# Patient Record
Sex: Female | Born: 1935
Health system: Southern US, Community
[De-identification: ages and names within clinical notes are randomized; demographics above are authoritative.]

## PROBLEM LIST (undated history)

## (undated) DIAGNOSIS — Z9889 Other specified postprocedural states: Secondary | ICD-10-CM

## (undated) DIAGNOSIS — F32A Depression, unspecified: Secondary | ICD-10-CM

## (undated) DIAGNOSIS — E039 Hypothyroidism, unspecified: Secondary | ICD-10-CM

## (undated) DIAGNOSIS — E7800 Pure hypercholesterolemia, unspecified: Secondary | ICD-10-CM

## (undated) DIAGNOSIS — I10 Essential (primary) hypertension: Secondary | ICD-10-CM

## (undated) DIAGNOSIS — M549 Dorsalgia, unspecified: Secondary | ICD-10-CM

## (undated) DIAGNOSIS — K449 Diaphragmatic hernia without obstruction or gangrene: Secondary | ICD-10-CM

## (undated) DIAGNOSIS — F329 Major depressive disorder, single episode, unspecified: Secondary | ICD-10-CM

## (undated) DIAGNOSIS — M1990 Unspecified osteoarthritis, unspecified site: Secondary | ICD-10-CM

## (undated) DIAGNOSIS — S42302A Unspecified fracture of shaft of humerus, left arm, initial encounter for closed fracture: Secondary | ICD-10-CM

## (undated) DIAGNOSIS — G8929 Other chronic pain: Secondary | ICD-10-CM

## (undated) DIAGNOSIS — R112 Nausea with vomiting, unspecified: Secondary | ICD-10-CM

## (undated) HISTORY — PX: CHOLECYSTECTOMY: SHX55

## (undated) HISTORY — PX: CATARACT EXTRACTION, BILATERAL: SHX1313

## (undated) HISTORY — DX: Dorsalgia, unspecified: M54.9

## (undated) HISTORY — DX: Other chronic pain: G89.29

---

## 1958-03-04 HISTORY — PX: RIGHT OOPHORECTOMY: SHX2359

## 2000-01-22 ENCOUNTER — Encounter: Payer: Self-pay | Admitting: Unknown Physician Specialty

## 2000-01-22 ENCOUNTER — Encounter: Admission: RE | Admit: 2000-01-22 | Discharge: 2000-01-22 | Payer: Self-pay | Admitting: Unknown Physician Specialty

## 2001-09-02 ENCOUNTER — Encounter: Admission: RE | Admit: 2001-09-02 | Discharge: 2001-09-02 | Payer: Self-pay | Admitting: Unknown Physician Specialty

## 2001-09-02 ENCOUNTER — Encounter: Payer: Self-pay | Admitting: Unknown Physician Specialty

## 2001-09-07 ENCOUNTER — Encounter: Payer: Self-pay | Admitting: Family Medicine

## 2001-09-07 ENCOUNTER — Encounter: Admission: RE | Admit: 2001-09-07 | Discharge: 2001-09-07 | Payer: Self-pay | Admitting: Family Medicine

## 2002-09-27 ENCOUNTER — Encounter: Admission: RE | Admit: 2002-09-27 | Discharge: 2002-09-27 | Payer: Self-pay | Admitting: Unknown Physician Specialty

## 2002-09-27 ENCOUNTER — Encounter: Payer: Self-pay | Admitting: Unknown Physician Specialty

## 2003-02-28 ENCOUNTER — Encounter: Admission: RE | Admit: 2003-02-28 | Discharge: 2003-02-28 | Payer: Self-pay | Admitting: Orthopedic Surgery

## 2003-03-18 ENCOUNTER — Encounter: Admission: RE | Admit: 2003-03-18 | Discharge: 2003-03-18 | Payer: Self-pay | Admitting: Orthopedic Surgery

## 2003-04-12 ENCOUNTER — Encounter: Admission: RE | Admit: 2003-04-12 | Discharge: 2003-04-12 | Payer: Self-pay | Admitting: Orthopedic Surgery

## 2003-07-26 ENCOUNTER — Ambulatory Visit (HOSPITAL_COMMUNITY): Admission: RE | Admit: 2003-07-26 | Discharge: 2003-07-26 | Payer: Self-pay | Admitting: Ophthalmology

## 2003-12-26 ENCOUNTER — Ambulatory Visit (HOSPITAL_COMMUNITY): Admission: RE | Admit: 2003-12-26 | Discharge: 2003-12-26 | Payer: Self-pay | Admitting: Unknown Physician Specialty

## 2004-04-09 ENCOUNTER — Ambulatory Visit: Payer: Self-pay | Admitting: Orthopedic Surgery

## 2004-05-11 ENCOUNTER — Encounter: Admission: RE | Admit: 2004-05-11 | Discharge: 2004-05-11 | Payer: Self-pay | Admitting: Orthopedic Surgery

## 2004-05-24 ENCOUNTER — Encounter: Admission: RE | Admit: 2004-05-24 | Discharge: 2004-05-24 | Payer: Self-pay | Admitting: Orthopedic Surgery

## 2004-06-08 ENCOUNTER — Encounter: Admission: RE | Admit: 2004-06-08 | Discharge: 2004-06-08 | Payer: Self-pay | Admitting: Orthopedic Surgery

## 2004-06-27 ENCOUNTER — Ambulatory Visit: Payer: Self-pay | Admitting: Orthopedic Surgery

## 2004-10-18 ENCOUNTER — Ambulatory Visit: Payer: Self-pay | Admitting: Orthopedic Surgery

## 2004-10-22 ENCOUNTER — Ambulatory Visit (HOSPITAL_COMMUNITY): Admission: RE | Admit: 2004-10-22 | Discharge: 2004-10-22 | Payer: Self-pay | Admitting: Internal Medicine

## 2005-01-14 ENCOUNTER — Ambulatory Visit (HOSPITAL_COMMUNITY): Admission: RE | Admit: 2005-01-14 | Discharge: 2005-01-14 | Payer: Self-pay | Admitting: Family Medicine

## 2005-01-17 ENCOUNTER — Ambulatory Visit (HOSPITAL_COMMUNITY): Admission: RE | Admit: 2005-01-17 | Discharge: 2005-01-17 | Payer: Self-pay | Admitting: Family Medicine

## 2005-03-11 ENCOUNTER — Encounter: Payer: Self-pay | Admitting: Internal Medicine

## 2005-03-11 ENCOUNTER — Ambulatory Visit (HOSPITAL_COMMUNITY): Admission: RE | Admit: 2005-03-11 | Discharge: 2005-03-11 | Payer: Self-pay | Admitting: Internal Medicine

## 2005-03-11 ENCOUNTER — Ambulatory Visit: Payer: Self-pay | Admitting: Internal Medicine

## 2005-10-24 ENCOUNTER — Ambulatory Visit (HOSPITAL_COMMUNITY): Admission: RE | Admit: 2005-10-24 | Discharge: 2005-10-24 | Payer: Self-pay | Admitting: Internal Medicine

## 2005-11-20 ENCOUNTER — Ambulatory Visit: Payer: Self-pay | Admitting: Orthopedic Surgery

## 2005-11-25 ENCOUNTER — Ambulatory Visit (HOSPITAL_COMMUNITY): Admission: RE | Admit: 2005-11-25 | Discharge: 2005-11-25 | Payer: Self-pay | Admitting: Orthopedic Surgery

## 2005-11-26 ENCOUNTER — Ambulatory Visit (HOSPITAL_COMMUNITY): Admission: RE | Admit: 2005-11-26 | Discharge: 2005-11-26 | Payer: Self-pay | Admitting: Orthopedic Surgery

## 2005-12-02 ENCOUNTER — Ambulatory Visit: Payer: Self-pay | Admitting: Orthopedic Surgery

## 2005-12-05 ENCOUNTER — Encounter (HOSPITAL_COMMUNITY): Admission: RE | Admit: 2005-12-05 | Discharge: 2006-01-04 | Payer: Self-pay | Admitting: Orthopedic Surgery

## 2005-12-26 ENCOUNTER — Ambulatory Visit: Payer: Self-pay | Admitting: Orthopedic Surgery

## 2005-12-30 ENCOUNTER — Ambulatory Visit: Payer: Self-pay | Admitting: Orthopedic Surgery

## 2006-01-20 ENCOUNTER — Ambulatory Visit: Payer: Self-pay | Admitting: Orthopedic Surgery

## 2006-03-03 ENCOUNTER — Ambulatory Visit (HOSPITAL_COMMUNITY): Admission: RE | Admit: 2006-03-03 | Discharge: 2006-03-03 | Payer: Self-pay | Admitting: Family Medicine

## 2006-04-29 ENCOUNTER — Ambulatory Visit (HOSPITAL_COMMUNITY): Admission: RE | Admit: 2006-04-29 | Discharge: 2006-04-29 | Payer: Self-pay | Admitting: Family Medicine

## 2006-05-15 ENCOUNTER — Ambulatory Visit (HOSPITAL_COMMUNITY): Admission: RE | Admit: 2006-05-15 | Discharge: 2006-05-15 | Payer: Self-pay | Admitting: Family Medicine

## 2006-05-19 ENCOUNTER — Ambulatory Visit (HOSPITAL_COMMUNITY): Admission: RE | Admit: 2006-05-19 | Discharge: 2006-05-19 | Payer: Self-pay | Admitting: Unknown Physician Specialty

## 2006-06-03 ENCOUNTER — Ambulatory Visit: Payer: Self-pay | Admitting: Orthopedic Surgery

## 2006-07-16 ENCOUNTER — Ambulatory Visit: Payer: Self-pay | Admitting: Orthopedic Surgery

## 2006-10-23 ENCOUNTER — Ambulatory Visit: Payer: Self-pay | Admitting: Orthopedic Surgery

## 2006-11-24 ENCOUNTER — Ambulatory Visit: Payer: Self-pay | Admitting: Orthopedic Surgery

## 2006-12-25 ENCOUNTER — Telehealth: Payer: Self-pay | Admitting: Orthopedic Surgery

## 2007-01-21 ENCOUNTER — Encounter: Payer: Self-pay | Admitting: Orthopedic Surgery

## 2007-05-18 ENCOUNTER — Ambulatory Visit: Payer: Self-pay | Admitting: Orthopedic Surgery

## 2007-05-18 DIAGNOSIS — M7660 Achilles tendinitis, unspecified leg: Secondary | ICD-10-CM

## 2007-06-03 ENCOUNTER — Ambulatory Visit: Payer: Self-pay | Admitting: Orthopedic Surgery

## 2007-06-03 DIAGNOSIS — M545 Low back pain: Secondary | ICD-10-CM | POA: Insufficient documentation

## 2007-06-03 DIAGNOSIS — M5380 Other specified dorsopathies, site unspecified: Secondary | ICD-10-CM | POA: Insufficient documentation

## 2007-07-22 ENCOUNTER — Telehealth: Payer: Self-pay | Admitting: Orthopedic Surgery

## 2007-08-06 ENCOUNTER — Ambulatory Visit (HOSPITAL_COMMUNITY): Admission: RE | Admit: 2007-08-06 | Discharge: 2007-08-06 | Payer: Self-pay | Admitting: Unknown Physician Specialty

## 2007-11-12 ENCOUNTER — Telehealth: Payer: Self-pay | Admitting: Orthopedic Surgery

## 2007-12-11 ENCOUNTER — Telehealth: Payer: Self-pay | Admitting: Orthopedic Surgery

## 2007-12-31 ENCOUNTER — Ambulatory Visit (HOSPITAL_COMMUNITY): Admission: RE | Admit: 2007-12-31 | Discharge: 2007-12-31 | Payer: Self-pay | Admitting: Internal Medicine

## 2008-04-20 ENCOUNTER — Ambulatory Visit: Payer: Self-pay | Admitting: Orthopedic Surgery

## 2008-04-20 DIAGNOSIS — M542 Cervicalgia: Secondary | ICD-10-CM

## 2008-09-07 ENCOUNTER — Ambulatory Visit (HOSPITAL_COMMUNITY): Admission: RE | Admit: 2008-09-07 | Discharge: 2008-09-07 | Payer: Self-pay | Admitting: Unknown Physician Specialty

## 2009-04-03 ENCOUNTER — Ambulatory Visit: Payer: Self-pay | Admitting: Orthopedic Surgery

## 2009-04-12 ENCOUNTER — Ambulatory Visit (HOSPITAL_COMMUNITY): Admission: RE | Admit: 2009-04-12 | Discharge: 2009-04-12 | Payer: Self-pay | Admitting: Family Medicine

## 2009-05-03 ENCOUNTER — Encounter: Payer: Self-pay | Admitting: Orthopedic Surgery

## 2009-09-26 ENCOUNTER — Telehealth: Payer: Self-pay | Admitting: Orthopedic Surgery

## 2009-10-30 ENCOUNTER — Ambulatory Visit (HOSPITAL_COMMUNITY): Admission: RE | Admit: 2009-10-30 | Discharge: 2009-10-30 | Payer: Self-pay | Admitting: Unknown Physician Specialty

## 2009-12-17 ENCOUNTER — Encounter: Payer: Self-pay | Admitting: Orthopedic Surgery

## 2009-12-26 ENCOUNTER — Encounter: Payer: Self-pay | Admitting: Orthopedic Surgery

## 2009-12-26 ENCOUNTER — Ambulatory Visit (HOSPITAL_COMMUNITY): Admission: RE | Admit: 2009-12-26 | Discharge: 2009-12-26 | Payer: Self-pay | Admitting: Internal Medicine

## 2009-12-27 ENCOUNTER — Ambulatory Visit: Payer: Self-pay | Admitting: Orthopedic Surgery

## 2009-12-27 DIAGNOSIS — M659 Synovitis and tenosynovitis, unspecified: Secondary | ICD-10-CM

## 2010-01-09 ENCOUNTER — Encounter: Payer: Self-pay | Admitting: Orthopedic Surgery

## 2010-01-24 ENCOUNTER — Encounter: Admission: RE | Admit: 2010-01-24 | Discharge: 2010-01-24 | Payer: Self-pay | Admitting: Neurosurgery

## 2010-02-01 ENCOUNTER — Encounter: Admission: RE | Admit: 2010-02-01 | Discharge: 2010-02-01 | Payer: Self-pay | Admitting: Neurosurgery

## 2010-02-07 ENCOUNTER — Ambulatory Visit: Payer: Self-pay | Admitting: Orthopedic Surgery

## 2010-03-21 ENCOUNTER — Encounter
Admission: RE | Admit: 2010-03-21 | Discharge: 2010-03-21 | Payer: Self-pay | Source: Home / Self Care | Attending: Neurosurgery | Admitting: Neurosurgery

## 2010-04-03 NOTE — Medication Information (Signed)
Summary: Tax adviser   Imported By: Cammie Sickle 02/07/2010 10:42:43  _____________________________________________________________________  External Attachment:    Type:   Image     Comment:   External Document

## 2010-04-03 NOTE — Assessment & Plan Note (Signed)
Summary: 6 WK RE-CK LT FOOT/?XRAY/MEDICARE,AARP/CAF   Visit Type:  Follow-up Referring Provider:  Dr. Ouida Sills Primary Provider:  Dr. Ouida Sills  CC:  RECHECK LEFT FOOT.  History of Present Illness: I saw Kelli Gregory in the office today for a visit.  She is a 75 years old woman with the complaint of:  left ankle pain and swelling   This is a 75 yo female with severe pain and swelling over the medial side of her left ankle which came on suddenly overnight without trauma.  Dr Ouida Sills treated her for gout without relief: was put on Prednisone taper, Naproxen two times a day for 3 days, Colchicine for 10 days for gout.  Xrays left ankle and foot [facility] 12/26/09: no acute disease ? medial malleolar bone frag without known cause.   Meds: Lisinopril, Synthroid, Ibuprofen 800mg .  Today is recheck after Cam walker and Epson salt for Posterior tendon tear grade 3.  Feels better with boot on.     Allergies: No Known Drug Allergies  Physical Exam  Additional Exam:  GENERAL: Appearance is normal   CDV: normal pulse and temperature     Foot looks much better. No tenderness. No swelling. Still has flatfoot deformity.  Also has an ingrown RIGHT toenail on the LEFT with redness and tenderness. Advised see podiatrist for that   Impression & Recommendations:  Problem # 1:  TENOSYNOVITIS OF FOOT AND ANKLE (ICD-727.06) Assessment Improved  Convert to [facility] orthotics follow up as needed  Orders: Est. Patient Level II ([address]109)  Patient Instructions: 1)  [facility]  2)  REMOVE [facility]THOT[facility]    Orders Added: 1)  Est. Patient Level II [60454]

## 2010-04-03 NOTE — Letter (Signed)
Summary: Applic for handicapped placard  Applic for handicapped placard   Imported By: Cammie Sickle 06/30/2009 12:27:30  _____________________________________________________________________  External Attachment:    Type:   Image     Comment:   External Document

## 2010-04-03 NOTE — Progress Notes (Signed)
Summary: MRI appointment.  Phone Note Outgoing Call   Call placed by: Waldon Reining,  September 26, 2009 12:24 PM Call placed to: Patient Action Taken: Appt scheduled Summary of Call: I called to give patient her MRI appointment at [facility] on 09-29-09 at 8:15 am. Patient has [facility], authorization #[id number] and it expires on 11-10-09. Patient will follow up here for her results. Patient is aware to bring a copy of her films.

## 2010-04-03 NOTE — Medication Information (Signed)
Summary: Tax adviser   Imported By: Cammie Sickle 12/27/2009 16:52:59  _____________________________________________________________________  External Attachment:    Type:   Image     Comment:   External Document

## 2010-04-03 NOTE — Progress Notes (Signed)
Summary: Referral to Dr. Gerilyn Pilgrim for NCS.  Phone Note Outgoing Call   Call placed by: Waldon Reining,  September 26, 2009 12:27 PM Call placed to: Specialist Action Taken: Information Sent Summary of Call: I faxed a referral for this patient to Dr. Gerilyn Pilgrim for NCS of upper extremity for radiculopathy vs. carpal tunnel.

## 2010-04-03 NOTE — Assessment & Plan Note (Signed)
Summary: BI HEEL PAIN/MEDICARE/AARP/BSF   Visit Type:  Follow-up  CC:  bilateral heel pain.Marland Kitchen  History of Present Illness: I saw Kelli Gregory in the office today for a followup visit.  She is a 75 years old woman with the complaint of:  BILATERAL HEEL PAIN, ACHILLES TENDINITIS.  She's had treatment for bilateral Achilles tendinopathy with MRI documented disease, no tear.  Pain continues in the posterior aspect of the heel which is aching moderately severe has become constant and present for over 2 years, last injection done then.  She is currently doing exercises at home.  She does respond well to steroid Dosepak plus or minus injection   review of systems history of back pain which has become worse as well.  Does not seem to have radiculopathy.  Also complains of groin cramping and foot cramping intermittent, electrolytes normal reported to her by Dr. Ouida Sills.  Exam well-developed well-nourished well-groomed  Oriented x3  Mood affect normal  Gait and station she is limping  Mild varicose veins normal temperature both feet good pulses in both feet  Sensation normal both feet  No pathologic reflexes toes downgoing  Coordination and balance normal  Lower extremities lymph nodes normal  Both feet skin normal, Achilles tendon strength normal, ankle stability normal, range of motion ankle normal, tenderness in the Achilles tendon at the insertion.     Allergies: No Known Drug Allergies  Past History:  Past Medical History: Last updated: 04/20/2008 Cyst on liver anxiety htn  Past Surgical History: Last updated: 12/25/2006 Ovarian cyst Cholecystectomy  Review of Systems      See HPI   Impression & Recommendations:  Problem # 1:  ACHILLES TENDINITIS (ICD-726.71) Assessment Deteriorated  bilateral disease  Bilateral ankle injections no complications  //verbal consent was given  The retrocalcaneal bursa were injected in both ankles no complications  Medication  40 mg of Depo-Medrol 1 cc and 3 cc of 1% lidocaine  Orders: Est. Patient Level IV (45409) Joint Aspirate / Injection, Large (20610) Depo- Medrol 40mg  (J1030)  Patient Instructions: 1)  You have received an injection of cortisone today. You may experience increased pain at the injection site. Apply ice pack to the area for 20 minutes every 2 hours and take 2 xtra strength tylenol every 8 hours. This increased pain will usually resolve in 24 hours. The injection will take effect in 3-10 days.   2)  Do your exercises  3)  Ice daily  Prescriptions: STERAPRED DS 12 DAY 10 MG TABS (PREDNISONE)   #1 x 1   Entered and Authorized by:   Fuller Canada MD   Signed by:   Fuller Canada MD on 04/03/2009   Method used:   Faxed to ...       [facility]  [address] [phone]* (retail)       [address]       [place], [place]  [address]       Ph: [phone] or [phone]       Fax: [phone]   RxID:   [id number]

## 2010-04-03 NOTE — Letter (Signed)
Summary: note for referral  note for referral   Imported By: Jacklynn Ganong 01/10/2010 16:50:03  _____________________________________________________________________  External Attachment:    Type:   Image     Comment:   External Document

## 2010-04-03 NOTE — Letter (Signed)
Summary: History form  History form   Imported By: Jacklynn Ganong 01/11/2010 08:27:24  _____________________________________________________________________  External Attachment:    Type:   Image     Comment:   External Document

## 2010-04-03 NOTE — Letter (Signed)
Summary: Physician's order for cane  Physician's order for cane   Imported By: Jacklynn Ganong 01/09/2010 13:33:43  _____________________________________________________________________  External Attachment:    Type:   Image     Comment:   External Document

## 2010-04-03 NOTE — Assessment & Plan Note (Signed)
Summary: FX LEFT FOOT/MCR/FAGAN/BSF   Visit Type:  new problem Referring Provider:  Dr. Ouida Sills Primary Provider:  Dr. Ouida Sills  CC:  left ankle pain.  History of Present Illness: I saw Kelli Gregory in the office today for a new problem visit.  She is a 75 years old woman with the complaint of:  left ankle pain and swelling for 11 days.  This is a 75 yo female with severe pain and swelling over the medial side of her left ankle which came on suddenly overnight without trauma.  Dr Ouida Sills treated her for gout without relief: was put on Prednisone taper, Naproxen two times a day for 3 days, Colchicine for 10 days for gout.  Xrays left ankle and foot [facility] 12/26/09: no acute disease ? medial malleolar bone frag without known cause.   Meds: Lisinopril, Synthroid.      Current Medications (verified): 1)  Synthroid 2)  Lisinopril 10 Mg Tabs (Lisinopril)  Allergies (verified): No Known Drug Allergies  Past History:  Past Surgical History: Last updated: 12/25/2006 Ovarian cyst Cholecystectomy  Past Medical History: Cyst on liver anxiety htn thyroid back pain  Family History: Family History Coronary Heart Disease female < 43 Family History of Arthritis  Social History: Patient is divorced.  retired no smoking no alcohol 1 cup of coffee per day 12th grade ed  Review of Systems Constitutional:  Complains of fatigue; denies weight loss, weight gain, fever, and chills. Gastrointestinal:  Complains of heartburn; denies vomiting, diarrhea, constipation, and blood in your stools. Musculoskeletal:  Complains of joint pain; denies swelling, instability, stiffness, redness, heat, and muscle pain. HEENT:  Complains of watering; denies blurred or double vision, eye pain, and redness. Immunology:  Complains of seasonal allergies; denies sinus problems and allergic to bee stings.  The review of systems is negative for Cardiovascular, Respiratory, Genitourinary, Neurologic, Endocrine,  Psychiatric, Skin, and Hemoatologic.  Physical Exam  Skin:  intact without lesions or rashes Psych:  alert and cooperative; normal mood and affect; normal attention span and concentration   Foot/Ankle Exam  General:    Well-developed, well-nourished ,normal body habitus; no deformities, normal grooming.    Gait:    antalgic.    Skin:    Intact with no scars, lesions, rashes, cafe-au-lait spots or bruising.    Inspection:    swelling: MEDIAL ANKLE OVER THE POST TIB TENDON   TOO MANY TOES SIGN BILATERAL LEFT > RIGHT   FALLEN MEDIAL ARCH L > R   Palpation:    PTTtenderness L-medial malleoulus:   Vascular:    dorsalis pedis and posterior tibial pulses 2+ and symmetric, capillary refill < 2 seconds, normal hair pattern, no evidence of ischemia.   Sensory:    gross sensation intact bilaterally in lower extremities.    Motor:    SINGLE LEG HEEL RAISE PWEAKNESS; COULD NOT PERFORM 1 !  Reflexes:    Normal and symmetric Achilles reflexes bilaterally.    Ankle Exam:    Right:    Inspection:  Abnormal    Palpation:  Normal    Stability:  stable    NORMAL ROM     Left:    Inspection:  Abnormal    Palpation:  Abnormal       Location:  MIDFOOT    Stability:  stable    NORMAL ROM    Impression & Recommendations:  Problem # 1:  TENOSYNOVITIS OF FOOT AND ANKLE (ICD-727.06) Assessment New  PTTD LEFT ANKLE GRADE 2/3   CAM WALKER 6  WEEKS   Orders: Est. Patient Level IV (16109)  Patient Instructions: 1)  Grade 3 Posterior tendon tear 2)  go in boot for 6 weeks, wear while walking. 3)  Do Epson salt soaks for 20 minutes two times a day. 4)  come back in 6 weeks. 5)  If not better will need MRI.   Orders Added: 1)  Est. Patient Level IV [60454]

## 2010-04-03 NOTE — Consult Note (Signed)
Summary: Consult note from Dr. Channing Mutters  Consult note from Dr. Channing Mutters   Imported By: Jacklynn Ganong 05/11/2009 16:08:24  _____________________________________________________________________  External Attachment:    Type:   Image     Comment:   External Document

## 2010-04-17 ENCOUNTER — Other Ambulatory Visit: Payer: Self-pay | Admitting: Neurosurgery

## 2010-04-17 DIAGNOSIS — M47816 Spondylosis without myelopathy or radiculopathy, lumbar region: Secondary | ICD-10-CM

## 2010-04-18 ENCOUNTER — Ambulatory Visit (INDEPENDENT_AMBULATORY_CARE_PROVIDER_SITE_OTHER): Payer: Medicare Other | Admitting: Orthopedic Surgery

## 2010-04-18 ENCOUNTER — Encounter: Payer: Self-pay | Admitting: Orthopedic Surgery

## 2010-04-18 DIAGNOSIS — M659 Synovitis and tenosynovitis, unspecified: Secondary | ICD-10-CM

## 2010-04-19 ENCOUNTER — Ambulatory Visit
Admission: RE | Admit: 2010-04-19 | Discharge: 2010-04-19 | Disposition: A | Discharge status: Home / Self Care | Payer: Medicare Other | Source: Ambulatory Visit | Attending: Neurosurgery | Admitting: Neurosurgery

## 2010-04-19 DIAGNOSIS — M47816 Spondylosis without myelopathy or radiculopathy, lumbar region: Secondary | ICD-10-CM

## 2010-04-25 NOTE — Miscellaneous (Signed)
Summary: physical therapy order  physical therapy order   Imported By: Cammie Sickle 04/18/2010 20:24:14  _____________________________________________________________________  External Attachment:    Type:   Image     Comment:   External Document

## 2010-04-25 NOTE — Assessment & Plan Note (Signed)
Summary: reck foot,still hurts/mcr   Referring Provider:  Dr. Ouida Sills Primary Provider:  Dr. Ouida Sills   History of Present Illness: posterior tibial tendon dysfunction, type II.  Recurrent LEFT foot pain, medial ankle. Patient has already started wearing a cam walker for approximately one week with improvement in her symptoms. She is also taking ibuprofen as needed.  Pain level III/IV.  Review of systems having trouble with her lumbar spine schedule for epidural injection.  No new injuries to the LEFT ankle.  Examination shows swelling and tenderness over the medial malleolar region. She has decreased pushoff noted with a single-leg heel raise.  Impression posterior tibial tendon dysfunction, type II. Plan physical therapy for 6 weeks  Allergies: No Known Drug Allergies   Impression & Recommendations:  Problem # 1:  TENOSYNOVITIS OF FOOT AND ANKLE (ICD-727.06) Assessment Deteriorated  Orders: Est. Patient Level II (16109)  Patient Instructions: 1)  PHYSICAL THERAPY ON LEFT FOOT  2)  FOR POSTERIOR TIBIAL TENDON DISEASE  3)  Please schedule a follow-up appointment as needed.   Orders Added: 1)  Est. Patient Level II [60454]

## 2010-07-20 NOTE — Op Note (Signed)
NAME:  Kelli Gregory, Kelli Gregory                 ACCOUNT NO.:  [id number]   MEDICAL RECORD NO.:  [id number]          PATIENT TYPE:  AMB   LOCATION:  DAY                           FACILITY:  [facility]   PHYSICIAN:  R. Roetta Sessions, M.D. DATE OF BIRTH:  1935-08-23   DATE OF PROCEDURE:  03/11/2005  DATE OF DISCHARGE:                                 OPERATIVE REPORT   PROCEDURE:  Colonoscopy with biopsy.   INDICATIONS FOR PROCEDURE:  The patient is a 75 year old lady with no GI  symptoms sent over at courtesy of Dr. Ouida Sills for colorectal cancer screening.  She has never had her lower GI imaged. She has no family history of  colorectal neoplasia. Colonoscopy is now being done. This approach has been  discussed with the patient at length. Potential risks, benefits, and  alternatives have been reviewed and questions answered. She is agreeable.  Please see documentation in the medical record.   PROCEDURE NOTE:  O2 saturation, blood pressure, pulse, and respirations were  monitored throughout the entire procedure. Conscious sedation with Versed 5  mg IV and Demerol 125 mg IV in divided doses.   INSTRUMENT:  Olympus video chip system.   FINDINGS:  Digital rectal exam revealed no abnormalities.   ENDOSCOPIC FINDINGS:  Prep was good.   Rectum:  Examination of the rectal mucosa including retroflexed view of the  anal verge revealed no abnormalities.   Colon:  Colonic mucosa was surveyed from the rectosigmoid junction through  the left, transverse, and right colon to the area of the appendiceal  orifice, ileocecal valve, and cecum. These structures were well seen and  photographed for the record. From this level, the scope was slowly  withdrawn, and all previously mentioned mucosal surfaces were again seen.  The patient had a very long tortuous colon. It was redundant. The colonic  mucosa, however, was well seen and appeared normal aside from a 3-mm polyp  at 20 cm (rectosigmoid). It was cold  biopsied/removed. The patient tolerated  the procedure well and was reactive to endoscopy.   IMPRESSION:  Normal rectum. Diminutive polyp at 20 cm, cold  biopsied/removed. Long redundant but otherwise normal appearing colon.   RECOMMENDATIONS:  1.  Follow up on pathology.  2.  Further recommendations to follow.      Jonathon Bellows, M.D.  Electronically Signed     RMR/MEDQ  D:  03/11/2005  T:  03/11/2005  Job:  [id number]   cc:   Kingsley Callander. Ouida Sills, MD  Fax: [phone]

## 2010-07-23 ENCOUNTER — Other Ambulatory Visit: Payer: Self-pay | Admitting: *Deleted

## 2010-07-23 DIAGNOSIS — R52 Pain, unspecified: Secondary | ICD-10-CM

## 2010-07-23 MED ORDER — IBUPROFEN 800 MG PO TABS: 800.0000 mg | ORAL_TABLET | Freq: Three times a day (TID) | ORAL | Status: DC | PRN

## 2010-10-19 ENCOUNTER — Emergency Department (HOSPITAL_COMMUNITY)
Admission: EM | Admit: 2010-10-19 | Discharge: 2010-10-19 | Disposition: A | Discharge status: Home / Self Care | Payer: Medicare Other | Attending: Emergency Medicine | Admitting: Emergency Medicine

## 2010-10-19 ENCOUNTER — Emergency Department (HOSPITAL_COMMUNITY): Payer: Medicare Other

## 2010-10-19 ENCOUNTER — Encounter: Payer: Self-pay | Admitting: *Deleted

## 2010-10-19 DIAGNOSIS — W010XXA Fall on same level from slipping, tripping and stumbling without subsequent striking against object, initial encounter: Secondary | ICD-10-CM | POA: Insufficient documentation

## 2010-10-19 DIAGNOSIS — S52609A Unspecified fracture of lower end of unspecified ulna, initial encounter for closed fracture: Secondary | ICD-10-CM | POA: Insufficient documentation

## 2010-10-19 DIAGNOSIS — S52502A Unspecified fracture of the lower end of left radius, initial encounter for closed fracture: Secondary | ICD-10-CM

## 2010-10-19 DIAGNOSIS — S52509A Unspecified fracture of the lower end of unspecified radius, initial encounter for closed fracture: Secondary | ICD-10-CM | POA: Insufficient documentation

## 2010-10-19 DIAGNOSIS — Y92009 Unspecified place in unspecified non-institutional (private) residence as the place of occurrence of the external cause: Secondary | ICD-10-CM | POA: Insufficient documentation

## 2010-10-19 DIAGNOSIS — I10 Essential (primary) hypertension: Secondary | ICD-10-CM | POA: Insufficient documentation

## 2010-10-19 DIAGNOSIS — E7800 Pure hypercholesterolemia, unspecified: Secondary | ICD-10-CM | POA: Insufficient documentation

## 2010-10-19 HISTORY — DX: Pure hypercholesterolemia, unspecified: E78.00

## 2010-10-19 HISTORY — DX: Essential (primary) hypertension: I10

## 2010-10-19 MED ORDER — ONDANSETRON 8 MG PO TBDP
8.0000 mg | ORAL_TABLET | Freq: Once | ORAL | Status: AC
  Administered 2010-10-19: 8 mg via ORAL
  Filled 2010-10-19: qty 1

## 2010-10-19 MED ORDER — HYDROCODONE-ACETAMINOPHEN 5-325 MG PO TABS: 1.0000 | ORAL_TABLET | ORAL | Status: AC | PRN

## 2010-10-19 MED ORDER — HYDROMORPHONE HCL 1 MG/ML IJ SOLN
1.0000 mg | Freq: Once | INTRAMUSCULAR | Status: AC
  Administered 2010-10-19: 1 mg via INTRAMUSCULAR
  Filled 2010-10-19: qty 1

## 2010-10-19 MED ORDER — METOCLOPRAMIDE HCL 5 MG/ML IJ SOLN
10.0000 mg | Freq: Once | INTRAMUSCULAR | Status: AC
  Administered 2010-10-19: 10 mg via INTRAMUSCULAR
  Filled 2010-10-19: qty 2

## 2010-10-19 NOTE — ED Notes (Signed)
Pt c/o pain to left wrist; +deformity noted; pt states she tripped over a hose and fell on left wrist

## 2010-10-19 NOTE — ED Notes (Signed)
Pt fell outside around 1600 today. States she "tripped over a water hose when taking the dogs out." Denies hitting her head. No shoulder or elbow pain, no back, neck hip or leg pain.

## 2010-10-19 NOTE — ED Notes (Signed)
Patient in room vomiting, Dr. Fonnie Jarvis in room, patient had been given zofran ODT

## 2010-10-19 NOTE — ED Provider Notes (Signed)
Scribed for Donnetta Hutching, MD, the patient was seen in room 5. This chart was scribed by Jannette Fogo. This patient's care was started at 17:55.   CSN: [id number] Arrival date & time: 10/19/2010  5:12 PM  Chief Complaint  Patient presents with  . Wrist Pain    left   HPI Kelli Gregory is a 75 y.o. female who presents to the Emergency Department complaining of left wrist pain status post fall occuring prior to arrival. Patient states she tripped over a water hose, and fell landing onto the left wrist. She reports pain exacerbation with movement. Patient denies any head injury, neck pain, or other injuries. There are no other associated symptoms and no other alleviating or aggravating factors.    Past Medical History  Diagnosis Date  . Hypertension   . Hypercholesteremia     Past Surgical History  Procedure Date  . Cholecystectomy   . Right oophorectomy    MEDICATIONS:  Previous Medications   CITALOPRAM (CELEXA) 20 MG TABLET    Take 20 mg by mouth every morning.     GABAPENTIN (NEURONTIN) 600 MG TABLET    Take 600 mg by mouth at bedtime.     IBUPROFEN (ADVIL,MOTRIN) 800 MG TABLET    Take 1 tablet (800 mg total) by mouth every 8 (eight) hours as needed for pain.   LEVOTHYROXINE (SYNTHROID, LEVOTHROID) 112 MCG TABLET    Take 112 mcg by mouth daily.     LOSARTAN (COZAAR) 50 MG TABLET    Take 50 mg by mouth every morning.     SODIUM CHLORIDE (OCEAN) 0.65 % NASAL SPRAY    Place 2 sprays into the nose at bedtime as needed.       ALLERGIES:  Allergies as of 10/19/2010 - Review Complete 10/19/2010  Allergen Reaction Noted  . Poison sumac extract Itching, Swelling, and Rash 10/19/2010    FAMILY HISTORY:  No Pertinent Family History  SOCIAL HISTORY: Accompanied to the ED by spouse and sister-in-law.  History  Substance Use Topics  . Smoking status: Never Smoker   . Smokeless tobacco: Not on file  . Alcohol Use: No   Review of Systems  HENT: Negative for neck pain.     Musculoskeletal: Positive for joint swelling (left wrist ). Negative for back pain.       Left wrist pain   Neurological: Negative for headaches.  All other systems reviewed and are negative.   Physical Exam  BP 140/58  Pulse 82  Temp(Src) 98.2 F (36.8 C) (Oral)  Resp 18  Ht 5\' 7"  (1.702 m)  Wt 200 lb (90.719 kg)  BMI 31.32 kg/m2  SpO2 98%  Physical Exam  Constitutional: She is oriented to person, place, and time. She appears well-developed and well-nourished. No distress.  HENT:  Head: Normocephalic and atraumatic.  Mouth/Throat: Oropharynx is clear and moist.  Eyes: Pupils are equal, round, and reactive to light.  Neck: Neck supple.       Non-tender   Cardiovascular: Intact distal pulses.   Pulmonary/Chest: Effort normal.  Musculoskeletal: She exhibits tenderness (over the left distal radius ).  Neurological: She is alert and oriented to person, place, and time. No cranial nerve deficit.  Skin: Skin is warm and dry.  Psychiatric: She has a normal mood and affect. Her behavior is normal.    OTHER DATA REVIEWED: Nursing notes, vital signs, and past medical records reviewed.   DIAGNOSTIC STUDIES: Oxygen Saturation is 98% on room air, normal by my interpretation.  RADIOLOGY:  XRAY LEFT WRIST: 3+ View; Interpreted by Radiologist Dr. Lollie Marrow and reviewed by me: Comminuted displaced and angulated distal left radial metaphyseal fracture with questionable extension into the distal radioulnar and radiocarpal joints. Suspect tiny fracture at tip of ulnar styloid process. Per CMS PQRS reporting requirements (PQRS Measure 24): Given the patient's age of greater than 50 and the fracture site (hip, distal radius, or spine), the patient should be tested for osteoporosis using DXA, and the appropriate treatment considered based on the DXA results.  PROCEDURES: The patient was placed in a short arm sugar-tong splint (from forearm incorporating the hand and thumb). Following  splint application I rechecked the position and circulation and neuro function. The splint was in good position with good neurovascular function. The left wrist was well immobilized.   ED COURSE / COORDINATION OF CARE: 17:56 - ED physician discussed XRAY results with the patient and family. Recommended splint placement and advised patient to follow up with orthopedist. Patient requesting pain medications but refuses to take Oxycontin.  20:08 - Splint applied, patient stable for discharge    MDM: splint, referral to ortho  IMPRESSION: Diagnoses that have been ruled out:  Diagnoses that are still under consideration:  Final diagnoses:  Closed fracture of left distal radius    PLAN:  Home Referral Orthopedics The patient is to return the emergency department if there is any worsening of symptoms. I have reviewed the discharge instructions with the patient and family.   CONDITION ON DISCHARGE: Stable  MEDICATIONS GIVEN IN THE E.D.  Medications  levothyroxine (SYNTHROID, LEVOTHROID) 112 MCG tablet (not administered)  losartan (COZAAR) 50 MG tablet (not administered)  gabapentin (NEURONTIN) 600 MG tablet (not administered)  citalopram (CELEXA) 20 MG tablet (not administered)  sodium chloride (OCEAN) 0.65 % nasal spray (not administered)  HYDROcodone-acetaminophen (NORCO) 5-325 MG per tablet (not administered)  HYDROmorphone (DILAUDID) injection 1 mg (1 mg Intramuscular Given 10/19/10 1823)  ondansetron (ZOFRAN-ODT) disintegrating tablet 8 mg (8 mg Oral Given 10/19/10 1848)  metoCLOPramide (REGLAN) injection 10 mg (10 mg Intramuscular Given 10/19/10 1919)    DISCHARGE MEDICATIONS: New Prescriptions   HYDROCODONE-ACETAMINOPHEN (NORCO) 5-325 MG PER TABLET    Take 1-2 tablets by mouth every 4 (four) hours as needed for pain.    Procedures     I personally performed the services described in this documentation, which was scribed in my presence. The recorded information has been  reviewed and considered. No att. providers found   Donnetta Hutching, MD 10/22/10 [phone]

## 2010-10-22 ENCOUNTER — Ambulatory Visit (INDEPENDENT_AMBULATORY_CARE_PROVIDER_SITE_OTHER): Payer: Medicare Other | Admitting: Orthopedic Surgery

## 2010-10-22 ENCOUNTER — Encounter: Payer: Self-pay | Admitting: Orthopedic Surgery

## 2010-10-22 VITALS — Ht 66.0 in | Wt 203.0 lb

## 2010-10-22 DIAGNOSIS — S62109A Fracture of unspecified carpal bone, unspecified wrist, initial encounter for closed fracture: Secondary | ICD-10-CM

## 2010-10-22 MED ORDER — PROMETHAZINE HCL 25 MG RE SUPP: 25.0000 mg | Freq: Four times a day (QID) | RECTAL | Status: AC | PRN

## 2010-10-22 NOTE — Patient Instructions (Addendum)
You have been scheduled for surgery.  All surgeries carry some risk.  Remember you always have the option of continued nonsurgical treatment. However in this situation the risks vs. the benefits favor surgery as the best treatment option. The risks of the surgery includes the following but is not limited to bleeding, infection, pulmonary embolus, death from anesthesia, nerve injury vascular injury or need for further surgery, continued pain.  Specific to this procedure the following risks and complications are rare but possible Stiffness of the wrist and continued pain

## 2010-10-22 NOTE — H&P (Signed)
Chief complaint: wrist pain  HPI:(4) 75 yo femal with c/o pain left  wrist s/p fall. Date of injury August 17, secondary to loose.  Complains of 3/10 intermittent pain improved with ibuprofen.  Mild swelling no numbness no tingling    ROS:(2) Nausea from pain medication but no real ALLERGIES all other systems reviewed are negative  PFSH: (1)  Past Medical History  Diagnosis Date  . Hypertension   . Hypercholesteremia   . Back pain, chronic    Past Surgical History  Procedure Date  . Cholecystectomy   . Right oophorectomy    History   Social History  . Marital Status: Divorced    Spouse Name: N/A    Number of Children: N/A  . Years of Education: N/A   Occupational History  . Not on file.   Social History Main Topics  . Smoking status: Never Smoker   . Smokeless tobacco: Not on file  . Alcohol Use: No  . Drug Use: No  . Sexually Active:    Other Topics Concern  . Not on file   Social History Narrative  . No narrative on file     Physical Exam(12) GENERAL: normal development   CDV: pulses are normal   Skin: normal  Lymph: nodes were not palpable/normal  Psychiatric: awake, alert and oriented  Neuro: normal sensation  MSK LEFT wrist 1 Tenderness and deformity of the LEFT upper extremity over the LEFT distal radius 2 Range of motion limited secondary to pain 3 Fingers gallbladder could not perform grip strength test 4 Stability normal  Lower extremity exam  Ambulation is normal.  Inspection and palpation revealed no tenderness or abnormality in alignment in the lower extremities. Range of motion is full.  Strength is grade 5. Joint stability normal   Imaging: xrays [facility] Angulated displaced 2 part distal radius fracture  Assessment: Based on the patient's age and activity level and general well being recommended open treatment internal fixation with volar plate    Plan: Open treatment internal fixation LEFT wrist with DVR plate

## 2010-10-22 NOTE — Progress Notes (Signed)
Chief complaint: wrist pain  HPI:(4) 75 yo femal with c/o pain left  wrist s/p fall. Date of injury August 17, secondary to loose.  Complains of 3/10 intermittent pain improved with ibuprofen.  Mild swelling no numbness no tingling    ROS:(2) Nausea from pain medication but no real ALLERGIES all other systems reviewed are negative  PFSH: (1)  Past Medical History  Diagnosis Date  . Hypertension   . Hypercholesteremia   . Back pain, chronic    Past Surgical History  Procedure Date  . Cholecystectomy   . Right oophorectomy    History   Social History  . Marital Status: Divorced    Spouse Name: N/A    Number of Children: N/A  . Years of Education: N/A   Occupational History  . Not on file.   Social History Main Topics  . Smoking status: Never Smoker   . Smokeless tobacco: Not on file  . Alcohol Use: No  . Drug Use: No  . Sexually Active:    Other Topics Concern  . Not on file   Social History Narrative  . No narrative on file     Physical Exam(12) GENERAL: normal development   CDV: pulses are normal   Skin: normal  Lymph: nodes were not palpable/normal  Psychiatric: awake, alert and oriented  Neuro: normal sensation  MSK LEFT wrist 1 Tenderness and deformity of the LEFT upper extremity over the LEFT distal radius 2 Range of motion limited secondary to pain 3 Fingers gallbladder could not perform grip strength test 4 Stability normal  Lower extremity exam  Ambulation is normal.  Inspection and palpation revealed no tenderness or abnormality in alignment in the lower extremities. Range of motion is full.  Strength is grade 5. Joint stability normal   Imaging: xrays [facility] Angulated displaced 2 part distal radius fracture  Assessment: Based on the patient's age and activity level and general well being recommended open treatment internal fixation with volar plate    Plan: Open treatment internal fixation LEFT wrist with DVR plate  

## 2010-10-23 ENCOUNTER — Telehealth: Payer: Self-pay | Admitting: Orthopedic Surgery

## 2010-10-23 NOTE — Telephone Encounter (Signed)
No use the prescription she has   Surgery 1.5 hours

## 2010-10-23 NOTE — Telephone Encounter (Signed)
Patient called with questions (1) Due to not filling the written Rx for pain medication from the Emergency room physician, asking if she can tear up and have Dr. Romeo Apple write it for her: medication is Hydrocodone 5/325, 1 to 2 tablets by mouth every 4 hours as needed.  (2) Approximately how long the surgery may take so that she can let her family know. Ph# [phone]

## 2010-10-23 NOTE — Telephone Encounter (Signed)
Called patient and relayed

## 2010-10-24 ENCOUNTER — Encounter (HOSPITAL_COMMUNITY): Payer: Self-pay

## 2010-10-24 ENCOUNTER — Encounter (HOSPITAL_COMMUNITY)
Admission: RE | Admit: 2010-10-24 | Discharge: 2010-10-24 | Disposition: A | Discharge status: Home / Self Care | Payer: Medicare Other | Source: Ambulatory Visit | Attending: Orthopedic Surgery | Admitting: Orthopedic Surgery

## 2010-10-24 ENCOUNTER — Other Ambulatory Visit: Payer: Self-pay

## 2010-10-24 HISTORY — DX: Hypothyroidism, unspecified: E03.9

## 2010-10-24 HISTORY — DX: Depression, unspecified: F32.A

## 2010-10-24 HISTORY — DX: Nausea with vomiting, unspecified: R11.2

## 2010-10-24 HISTORY — DX: Unspecified fracture of shaft of humerus, left arm, initial encounter for closed fracture: S42.302A

## 2010-10-24 HISTORY — DX: Diaphragmatic hernia without obstruction or gangrene: K44.9

## 2010-10-24 HISTORY — DX: Major depressive disorder, single episode, unspecified: F32.9

## 2010-10-24 HISTORY — DX: Other specified postprocedural states: Z98.890

## 2010-10-24 HISTORY — DX: Unspecified osteoarthritis, unspecified site: M19.90

## 2010-10-24 LAB — CBC
HCT: 35.5 % — ABNORMAL LOW (ref 36.0–46.0)
Hemoglobin: 12 g/dL (ref 12.0–15.0)
MCH: 28.2 pg (ref 26.0–34.0)
MCHC: 33.8 g/dL (ref 30.0–36.0)
MCV: 83.5 fL (ref 78.0–100.0)

## 2010-10-24 LAB — BASIC METABOLIC PANEL
BUN: 12 mg/dL (ref 6–23)
CO2: 26 mEq/L (ref 19–32)
Chloride: 99 mEq/L (ref 96–112)
GFR calc non Af Amer: >60 mL/min (ref >60)
Glucose, Bld: 120 mg/dL — ABNORMAL HIGH (ref 70–99)
Potassium: 3.9 mEq/L (ref 3.5–5.1)

## 2010-10-24 NOTE — Patient Instructions (Signed)
20 Makaiyah A Kitamura  10/24/2010   Your procedure is scheduled on:  Friday, 10/26/10  Report to [facility] at 08:50 AM.  Call this number if you have problems the morning of surgery: [phone]   Remember:   Do not eat food:After Midnight.  Do not drink clear liquids: After Midnight.  Take these medicines the morning of surgery with A SIP OF WATER: Celexa, Cozaar, Synthroid, and hydrocodone if needed   Do not wear jewelry, make-up or nail polish.  Do not wear lotions, powders, or perfumes. You may wear deodorant.  Do not shave 48 hours prior to surgery.  Do not bring valuables to the hospital.  Contacts, dentures or bridgework may not be worn into surgery.  Leave suitcase in the car. After surgery it may be brought to your room.  For patients admitted to the hospital, checkout time is 11:00 AM the day of discharge.   Patients discharged the day of surgery will not be allowed to drive home.  Name and phone number of your driver: driver  Special Instructions: CHG Shower Use Special Wash: 1/2 bottle night before surgery and 1/2 bottle morning of surgery.   Please read over the following fact sheets that you were given: Pain Booklet, MRSA Information, Surgical Site Infection Prevention, Anesthesia Post-op Instructions and Care and Recovery After Surgery    Instructions Following General Anesthetic, Adult A nurse specialized in giving anesthesia (anesthetist) or a doctor specialized in giving anesthesia (anesthesiologist) gave you a medicine that made you sleep while a procedure was performed. For as long as 24 hours following this procedure, you may feel:  Dizzy.   Weak.   Drowsy.  AFTER YOUR SURGERY 1. After surgery, you will be taken to the recovery area where a nurse will monitor your progress. You will be allowed to go home when you are awake, stable, taking fluids well, and without complications.  2.  3. For the first 24 hours following an anesthetic:   Have a responsible person  with you.   Do not drive a car. If you are alone, do not take public transportation.   Do not drink alcohol.   Do not take medicine that has not been prescribed by your caregiver.   Do not sign important papers or make important decisions.   You may resume normal diet and activities as directed.   Change bandages (dressings) as directed.   Only take over-the-counter or prescription medicines for pain, discomfort, or fever as directed by your caregiver.  If you have questions or problems that seem related to the anesthetic, call the hospital and ask for the anesthetist or anesthesiologist on call. SEEK IMMEDIATE MEDICAL CARE IF:  You develop a rash.   You have difficulty breathing.   You have chest pain.   You develop any allergic problems.  Document Released: 05/27/2000 Document Re-Released: 05/15/2009 [facility] Patient Information 2011 [place], [place].

## 2010-10-25 ENCOUNTER — Telehealth: Payer: Self-pay | Admitting: Orthopedic Surgery

## 2010-10-25 NOTE — Telephone Encounter (Signed)
Pre-authorization not required for out-patient surgery scheduled 10/26/10 at [facility], left wrist, per Medicare guidelines (insurers Medicare and secondary, AARP, also follows Medicare guidelines.

## 2010-10-26 ENCOUNTER — Ambulatory Visit (HOSPITAL_COMMUNITY): Payer: Medicare Other

## 2010-10-26 ENCOUNTER — Ambulatory Visit (HOSPITAL_COMMUNITY)
Admission: RE | Admit: 2010-10-26 | Discharge: 2010-10-26 | Disposition: A | Discharge status: Home / Self Care | Payer: Medicare Other | Source: Ambulatory Visit | Attending: Orthopedic Surgery | Admitting: Orthopedic Surgery

## 2010-10-26 ENCOUNTER — Encounter (HOSPITAL_COMMUNITY): Payer: Self-pay | Admitting: *Deleted

## 2010-10-26 ENCOUNTER — Encounter (HOSPITAL_COMMUNITY): Payer: Self-pay | Admitting: Anesthesiology

## 2010-10-26 ENCOUNTER — Ambulatory Visit (HOSPITAL_COMMUNITY): Payer: Medicare Other | Admitting: Anesthesiology

## 2010-10-26 ENCOUNTER — Encounter (HOSPITAL_COMMUNITY)
Admission: RE | Disposition: A | Discharge status: Home / Self Care | Payer: Self-pay | Source: Ambulatory Visit | Attending: Orthopedic Surgery

## 2010-10-26 DIAGNOSIS — W19XXXA Unspecified fall, initial encounter: Secondary | ICD-10-CM | POA: Insufficient documentation

## 2010-10-26 DIAGNOSIS — E7800 Pure hypercholesterolemia, unspecified: Secondary | ICD-10-CM | POA: Insufficient documentation

## 2010-10-26 DIAGNOSIS — S52532A Colles' fracture of left radius, initial encounter for closed fracture: Secondary | ICD-10-CM

## 2010-10-26 DIAGNOSIS — Z79899 Other long term (current) drug therapy: Secondary | ICD-10-CM | POA: Insufficient documentation

## 2010-10-26 DIAGNOSIS — Z01812 Encounter for preprocedural laboratory examination: Secondary | ICD-10-CM | POA: Insufficient documentation

## 2010-10-26 DIAGNOSIS — S52539A Colles' fracture of unspecified radius, initial encounter for closed fracture: Secondary | ICD-10-CM

## 2010-10-26 DIAGNOSIS — I10 Essential (primary) hypertension: Secondary | ICD-10-CM | POA: Insufficient documentation

## 2010-10-26 DIAGNOSIS — Z01810 Encounter for preprocedural cardiovascular examination: Secondary | ICD-10-CM | POA: Insufficient documentation

## 2010-10-26 HISTORY — PX: ORIF WRIST FRACTURE: SHX2133

## 2010-10-26 SURGERY — OPEN REDUCTION INTERNAL FIXATION (ORIF) WRIST FRACTURE
Anesthesia: General | Site: Wrist | Laterality: Left | Wound class: Clean

## 2010-10-26 MED ORDER — LACTATED RINGERS IV SOLN: INTRAVENOUS | Status: DC

## 2010-10-26 MED ORDER — GLYCOPYRROLATE 0.2 MG/ML IJ SOLN
INTRAMUSCULAR | Status: AC
  Administered 2010-10-26: 0.2 mg via INTRAVENOUS
  Filled 2010-10-26: qty 1

## 2010-10-26 MED ORDER — GLYCOPYRROLATE 0.2 MG/ML IJ SOLN
0.2000 mg | Freq: Once | INTRAMUSCULAR | Status: AC | PRN
  Administered 2010-10-26: 0.2 mg via INTRAVENOUS

## 2010-10-26 MED ORDER — CEFAZOLIN SODIUM-DEXTROSE 2-3 GM-% IV SOLR: 2.0000 g | INTRAVENOUS | Status: DC

## 2010-10-26 MED ORDER — CELECOXIB 100 MG PO CAPS
ORAL_CAPSULE | ORAL | Status: AC
  Administered 2010-10-26: 400 mg via ORAL
  Filled 2010-10-26: qty 4

## 2010-10-26 MED ORDER — ONDANSETRON HCL 4 MG/2ML IJ SOLN
4.0000 mg | Freq: Once | INTRAMUSCULAR | Status: AC
Start: 2010-10-26 — End: 2010-10-26
  Administered 2010-10-26: 4 mg via INTRAVENOUS

## 2010-10-26 MED ORDER — CEFAZOLIN SODIUM 1-5 GM-% IV SOLN
INTRAVENOUS | Status: AC
  Filled 2010-10-26: qty 100

## 2010-10-26 MED ORDER — ONDANSETRON HCL 4 MG/2ML IJ SOLN
INTRAMUSCULAR | Status: DC | PRN
  Administered 2010-10-26: 4 mg via INTRAVENOUS

## 2010-10-26 MED ORDER — FENTANYL CITRATE 0.05 MG/ML IJ SOLN
INTRAMUSCULAR | Status: AC
  Administered 2010-10-26: 50 ug via INTRAVENOUS
  Filled 2010-10-26: qty 5

## 2010-10-26 MED ORDER — ONDANSETRON HCL 4 MG/2ML IJ SOLN: 4.0000 mg | Freq: Once | INTRAMUSCULAR | Status: DC | PRN

## 2010-10-26 MED ORDER — ROCURONIUM BROMIDE 100 MG/10ML IV SOLN
INTRAVENOUS | Status: DC | PRN
  Administered 2010-10-26: 5 mg via INTRAVENOUS

## 2010-10-26 MED ORDER — PROPOFOL 10 MG/ML IV EMUL
INTRAVENOUS | Status: DC | PRN
  Administered 2010-10-26: 20 mg via INTRAVENOUS
  Administered 2010-10-26: 120 mg via INTRAVENOUS

## 2010-10-26 MED ORDER — ACETAMINOPHEN 325 MG PO TABS: 325.0000 mg | ORAL_TABLET | ORAL | Status: DC | PRN

## 2010-10-26 MED ORDER — DEXAMETHASONE SODIUM PHOSPHATE 4 MG/ML IJ SOLN
INTRAMUSCULAR | Status: AC
  Administered 2010-10-26: 4 mg via INTRAVENOUS
  Filled 2010-10-26: qty 1

## 2010-10-26 MED ORDER — CEFAZOLIN SODIUM 1-5 GM-% IV SOLN
INTRAVENOUS | Status: DC | PRN
  Administered 2010-10-26: 2 g via INTRAVENOUS

## 2010-10-26 MED ORDER — FENTANYL CITRATE 0.05 MG/ML IJ SOLN
25.0000 ug | INTRAMUSCULAR | Status: DC | PRN
  Administered 2010-10-26 (×2): 50 ug via INTRAVENOUS

## 2010-10-26 MED ORDER — SODIUM CHLORIDE 0.9 % IR SOLN
Status: DC | PRN
  Administered 2010-10-26: 1

## 2010-10-26 MED ORDER — MIDAZOLAM HCL 2 MG/2ML IJ SOLN
1.0000 mg | INTRAMUSCULAR | Status: DC | PRN
  Administered 2010-10-26: 2 mg via INTRAVENOUS

## 2010-10-26 MED ORDER — ONDANSETRON HCL 4 MG/2ML IJ SOLN
INTRAMUSCULAR | Status: AC
  Filled 2010-10-26: qty 2

## 2010-10-26 MED ORDER — FENTANYL CITRATE 0.05 MG/ML IJ SOLN
INTRAMUSCULAR | Status: AC
  Administered 2010-10-26: 50 ug via INTRAVENOUS
  Filled 2010-10-26: qty 2

## 2010-10-26 MED ORDER — SUCCINYLCHOLINE CHLORIDE 20 MG/ML IJ SOLN
INTRAMUSCULAR | Status: AC
  Filled 2010-10-26: qty 1

## 2010-10-26 MED ORDER — SUCCINYLCHOLINE CHLORIDE 20 MG/ML IJ SOLN
INTRAMUSCULAR | Status: DC | PRN
  Administered 2010-10-26: 100 mg via INTRAVENOUS

## 2010-10-26 MED ORDER — PROPOFOL 10 MG/ML IV EMUL
INTRAVENOUS | Status: AC
  Filled 2010-10-26: qty 20

## 2010-10-26 MED ORDER — FENTANYL CITRATE 0.05 MG/ML IJ SOLN
INTRAMUSCULAR | Status: DC | PRN
  Administered 2010-10-26 (×5): 50 ug via INTRAVENOUS

## 2010-10-26 MED ORDER — ONDANSETRON HCL 4 MG/2ML IJ SOLN
INTRAMUSCULAR | Status: AC
  Administered 2010-10-26: 4 mg via INTRAVENOUS
  Filled 2010-10-26: qty 2

## 2010-10-26 MED ORDER — ROCURONIUM BROMIDE 50 MG/5ML IV SOLN
INTRAVENOUS | Status: AC
  Filled 2010-10-26: qty 1

## 2010-10-26 MED ORDER — LACTATED RINGERS IV SOLN
INTRAVENOUS | Status: DC
  Administered 2010-10-26: 1000 mL via INTRAVENOUS

## 2010-10-26 MED ORDER — HYDROCODONE-ACETAMINOPHEN 5-325 MG PO TABS
1.0000 | ORAL_TABLET | Freq: Once | ORAL | Status: AC
  Administered 2010-10-26: 1 via ORAL

## 2010-10-26 MED ORDER — CELECOXIB 100 MG PO CAPS
400.0000 mg | ORAL_CAPSULE | Freq: Once | ORAL | Status: AC
  Administered 2010-10-26: 400 mg via ORAL

## 2010-10-26 MED ORDER — LIDOCAINE HCL (PF) 1 % IJ SOLN
INTRAMUSCULAR | Status: AC
  Filled 2010-10-26: qty 5

## 2010-10-26 MED ORDER — BUPIVACAINE-EPINEPHRINE PF 0.5-1:200000 % IJ SOLN
INTRAMUSCULAR | Status: AC
  Filled 2010-10-26: qty 10

## 2010-10-26 MED ORDER — ONDANSETRON HCL 4 MG/2ML IJ SOLN
4.0000 mg | Freq: Once | INTRAMUSCULAR | Status: AC
  Administered 2010-10-26: 4 mg via INTRAVENOUS

## 2010-10-26 MED ORDER — HYDROCODONE-ACETAMINOPHEN 5-325 MG PO TABS
ORAL_TABLET | ORAL | Status: AC
  Administered 2010-10-26: 1 via ORAL
  Filled 2010-10-26: qty 1

## 2010-10-26 MED ORDER — MIDAZOLAM HCL 2 MG/2ML IJ SOLN
INTRAMUSCULAR | Status: AC
  Administered 2010-10-26: 2 mg via INTRAVENOUS
  Filled 2010-10-26: qty 2

## 2010-10-26 MED ORDER — DEXAMETHASONE SODIUM PHOSPHATE 4 MG/ML IJ SOLN
4.0000 mg | Freq: Once | INTRAMUSCULAR | Status: AC
  Administered 2010-10-26: 4 mg via INTRAVENOUS

## 2010-10-26 SURGICAL SUPPLY — 59 items
BAG HAMPER (MISCELLANEOUS) ×2 IMPLANT
BANDAGE ELASTIC 3 VELCRO ST LF (GAUZE/BANDAGES/DRESSINGS) IMPLANT
BANDAGE ESMARK 4X12 BL STRL LF (DISPOSABLE) ×1 IMPLANT
BANDAGE GAUZE ELAST BULKY 4 IN (GAUZE/BANDAGES/DRESSINGS) ×1 IMPLANT
BLADE SURG SZ10 CARB STEEL (BLADE) ×2 IMPLANT
BNDG CMPR 12X4 ELC STRL LF (DISPOSABLE) ×1
BNDG COHESIVE 4X5 TAN NS LF (GAUZE/BANDAGES/DRESSINGS) ×2 IMPLANT
BNDG ESMARK 4X12 BLUE STRL LF (DISPOSABLE) ×2
CAP PIN PROTECTOR ORTHO WHT (CAP) IMPLANT
CHLORAPREP W/TINT 26ML (MISCELLANEOUS) ×2 IMPLANT
CLOTH BEACON ORANGE TIMEOUT ST (SAFETY) ×2 IMPLANT
COVER LIGHT HANDLE STERIS (MISCELLANEOUS) ×4 IMPLANT
COVER PROBE W GEL 5X96 (DRAPES) ×2 IMPLANT
CUFF TOURNIQUET SINGLE 18IN (TOURNIQUET CUFF) ×2 IMPLANT
DRAPE C-ARM FOLDED MOBILE STRL (DRAPES) ×2 IMPLANT
DRSG XEROFORM 1X8 (GAUZE/BANDAGES/DRESSINGS) ×2 IMPLANT
ELECT REM PT RETURN 9FT ADLT (ELECTROSURGICAL) ×2
ELECTRODE REM PT RTRN 9FT ADLT (ELECTROSURGICAL) ×1 IMPLANT
GAUZE SPONGE 4X4 12PLY STRL LF (GAUZE/BANDAGES/DRESSINGS) ×1 IMPLANT
GAUZE XEROFORM 5X9 LF (GAUZE/BANDAGES/DRESSINGS) ×1 IMPLANT
GLOVE BIOGEL PI IND STRL 7.0 (GLOVE) IMPLANT
GLOVE BIOGEL PI INDICATOR 7.0 (GLOVE) ×1
GLOVE ECLIPSE 7.0 STRL STRAW (GLOVE) ×1 IMPLANT
GLOVE ECLIPSE 8.0 STRL XLNG CF (GLOVE) ×2 IMPLANT
GLOVE SKINSENSE NS SZ8.0 LF (GLOVE) ×1
GLOVE SKINSENSE STRL SZ8.0 LF (GLOVE) ×1 IMPLANT
GLOVE SS BIOGEL STRL SZ 8 (GLOVE) IMPLANT
GLOVE SS N UNI LF 8.5 STRL (GLOVE) ×2 IMPLANT
GLOVE SUPERSENSE BIOGEL SZ 8 (GLOVE) ×1
GOWN BRE IMP SLV AUR XL STRL (GOWN DISPOSABLE) ×4 IMPLANT
GOWN STRL REIN XL XLG (GOWN DISPOSABLE) ×2 IMPLANT
K-WIRE 229MX1.6 (WIRE) IMPLANT
KIT ROOM TURNOVER APOR (KITS) ×2 IMPLANT
MANIFOLD NEPTUNE II (INSTRUMENTS) ×2 IMPLANT
NDL HYPO 21X1.5 SAFETY (NEEDLE) ×1 IMPLANT
NEEDLE HYPO 21X1.5 SAFETY (NEEDLE) ×2 IMPLANT
NS IRRIG 1000ML POUR BTL (IV SOLUTION) ×2 IMPLANT
PACK BASIC LIMB (CUSTOM PROCEDURE TRAY) ×2 IMPLANT
PAD ARMBOARD 7.5X6 YLW CONV (MISCELLANEOUS) ×2 IMPLANT
PAD CAST 4YDX4 CTTN HI CHSV (CAST SUPPLIES) ×1 IMPLANT
PADDING CAST COTTON 4X4 STRL (CAST SUPPLIES) ×2
PEG THREADED 2.5MMX18MM LONG (Peg) ×3 IMPLANT
PEG THREADED 2.5MMX20MM LONG (Peg) ×4 IMPLANT
PIN CAPS ORTHO GREEN .062 (PIN) IMPLANT
PLATE SHORT 24.4X51.3 LT (Plate) ×1 IMPLANT
SCREW BN 12X3.5XNS CORT TI (Screw) IMPLANT
SCREW CORT 3.5X12 (Screw) ×6 IMPLANT
SCREW MULTI DIRECT 20MM (Screw) ×1 IMPLANT
SET BASIN LINEN APH (SET/KITS/TRAYS/PACK) ×2 IMPLANT
SPLINT IMMOBILIZER J 3INX20FT (CAST SUPPLIES) ×1
SPLINT J IMMOBILIZER 3X20FT (CAST SUPPLIES) ×1 IMPLANT
SPONGE GAUZE 4X4 12PLY (GAUZE/BANDAGES/DRESSINGS) ×2 IMPLANT
SPONGE LAP 4X18 X RAY DECT (DISPOSABLE) ×2 IMPLANT
STAPLER VISISTAT 35W (STAPLE) ×2 IMPLANT
SUT ETHILON 3 0 FSL (SUTURE) IMPLANT
SUT MON AB 2-0 SH 27 (SUTURE) ×2
SUT MON AB 2-0 SH27 (SUTURE) ×1 IMPLANT
SYR CONTROL 10ML LL (SYRINGE) ×2 IMPLANT
WATER STERILE IRR 1000ML POUR (IV SOLUTION) ×2 IMPLANT

## 2010-10-26 NOTE — Brief Op Note (Signed)
10/26/2010  3:18 PM  PATIENT:  Kelli Gregory  75 y.o. female  PRE-OPERATIVE DIAGNOSIS:  Left wrist fracture  POST-OPERATIVE DIAGNOSIS:  Left wrist fracture  PROCEDURE:  Procedure(s): OPEN REDUCTION INTERNAL FIXATION (ORIF) WRIST FRACTURE  SURGEON:  Surgeon(s): Fuller Canada, MD  PHYSICIAN ASSISTANT:   ASSISTANTS: Wayne McFatter   ANESTHESIA:   regional  ESTIMATED BLOOD LOSS: * No blood loss amount entered *   BLOOD ADMINISTERED:none  DRAINS: none   LOCAL MEDICATIONS USED:  MARCAINE 30CC  SPECIMEN:  No Specimen  DISPOSITION OF SPECIMEN:  N/A  COUNTS:  YES  TOURNIQUET:   Total Tourniquet Time Documented: Upper Arm (Left) - 58 minutes  DICTATION #:   PLAN OF CARE: pacu then home  PATIENT DISPOSITION:  PACU - hemodynamically stable.   Delay start of Pharmacological VTE agent (>24hrs) due to surgical blood loss or risk of bleeding:  yes

## 2010-10-26 NOTE — Anesthesia Postprocedure Evaluation (Signed)
Anesthesia Post Note  Patient: Kelli Gregory  Procedure(s) Performed:  OPEN REDUCTION INTERNAL FIXATION (ORIF) WRIST FRACTURE  Anesthesia type: General  Patient location: PACU  Post pain: Pain level controlled  Post assessment: Post-op Vital signs reviewed, Patient's Cardiovascular Status Stable, Respiratory Function Stable and No signs of Nausea or vomiting  Last Vitals:  Filed Vitals:   10/26/10 1530  BP:   Pulse: 100  Temp:   Resp: 13    Post vital signs: Reviewed and stable  Level of consciousness: awake, alert , oriented and patient cooperative  Complications: No apparent anesthesia complications

## 2010-10-26 NOTE — Anesthesia Preprocedure Evaluation (Addendum)
Anesthesia Evaluation  Name, MR# and DOB Patient awake  General Assessment Comment  Reviewed: Allergy & Precautions, H&P , NPO status , Patient's Chart, lab work & pertinent test results  History of Anesthesia Complications (+) PONV  Airway Mallampati: I TM Distance: >3 FB Neck ROM: Full    Dental  (+) Partial Upper   Pulmonary    pulmonary exam normalPulmonary Exam Normal     Cardiovascular hypertension, Pt. on medications Regular Normal    Neuro/Psych    (+) PSYCHIATRIC DISORDERS, Depression,   Neuromuscular disease   GI/Hepatic/Renal   negative Liver ROS  negative Renal ROS  hiatal hernia,       Endo/Other  (+) Hypothyroidism,      Abdominal Normal abdominal exam  (+)   Musculoskeletal  (+) Arthritis -, Osteoarthritis,    Hematology negative hematology ROS (+)   Peds  Reproductive/Obstetrics negative OB ROS    Anesthesia Other Findings             Anesthesia Physical Anesthesia Plan  ASA: II  Anesthesia Plan: General   Post-op Pain Management:    Induction: Intravenous, Rapid sequence and Cricoid pressure planned  Airway Management Planned: Oral ETT  Additional Equipment:   Intra-op Plan:   Post-operative Plan: Extubation in OR  Informed Consent: I have reviewed the patients History and Physical, chart, labs and discussed the procedure including the risks, benefits and alternatives for the proposed anesthesia with the patient or authorized representative who has indicated his/her understanding and acceptance.     Plan Discussed with: CRNA  Anesthesia Plan Comments:         Anesthesia Quick Evaluation

## 2010-10-26 NOTE — Interval H&P Note (Signed)
History and Physical Interval Note:   10/26/2010   1:11 PM   Kelli Gregory  has presented today for surgery, with the diagnosis of Left wrist fracture  The various methods of treatment have been discussed with the patient and family. After consideration of risks, benefits and other options for treatment, the patient has consented to  Procedure(s):LEFT OPEN REDUCTION INTERNAL FIXATION (ORIF) WRIST FRACTURE as a surgical intervention .  I have reviewed the patients' chart and labs.  Questions were answered to the patient's satisfaction.     Fuller Canada  MD

## 2010-10-26 NOTE — Op Note (Signed)
Preop diagnosis closed fracture left distal radius (2 parts) Postop diagnosis same Procedure open treatment internal fixation with DVR volar wrist plate Surgeon Romeo Apple Assisted by [facility] Tourniquet time 58 minutes, tourniquet pressure 250 mmHg Findings two-part comminuted fracture left distal radius with significant crush injury to the metaphyseal region  In the preop area the patient was identified and the site was marked in the chart was updated  The patient was given appropriate amount of Ancef. After intubation and prep of the skin the left upper extremity was draped sterilely. The tourniquet was elevated after exsanguination of the limb with a 4 inch Esmarch.  Closed manipulation was attempted and was unsuccessful.  A volar incision was made over the flexor carpi radialis tendon. The incision was carried through subcutaneous tissue. The FCR tendon sheath was opened and the tendon was retracted ulnarward. The floor of the sheath was opened. The interval between the flexor tendons and the pronator quadratus was entered. The pronator quadratus was elevated from the radius and retracted ulnarward. After deep retractors were placed the floor of the first compartment was opened and the brachial radialis was released. Open manipulation of the fracture restored the length and volar tilt.  The plate was then applied to the shaft with 2.5 drill bit and a 3.5 screw. Starting on the ulnar side of the plate a 2.0 drill bit was used to drill the hole into the distal fragment. This was continued across the proximal row and then the distal row. Radiographs were obtained to show that the fracture was reduced and that the pegs were not in the joint.  The multidirectional hole was used and drilled with a 20 drill bit followed by a multidirectional screw to capture the ulnar styloid fragment  2.5 drill bit was then used to place remaining to 3.5 screws. Final x-rays were obtained and the fracture was  reduced the hardware was in good position.  After thorough irrigation the wound was closed with 2-0 Monocryl and staples. 30 cc of Marcaine was injected into the subcutaneous tissues and a sterile dressing with a volar splint was applied.  The patient was extubated and taken to the recovery room in stable condition.  She will followup on Monday for a cast check.

## 2010-10-26 NOTE — Transfer of Care (Signed)
  Anesthesia Post-op Note  Patient: Kelli Gregory  Procedure(s) Performed:  OPEN REDUCTION INTERNAL FIXATION (ORIF) WRIST FRACTURE  Patient Location: PACU  Anesthesia Type: General  Level of Consciousness: awake and alert   Airway and Oxygen Therapy: Patient Spontanous Breathing and Patient connected to face mask oxygen  Post-op Pain: mild  Post-op Assessment: Post-op Vital signs reviewed, Patient's Cardiovascular Status Stable and Respiratory Function Stable  Post-op Vital Signs: Reviewed and stable  Complications: No apparent anesthesia complications

## 2010-10-26 NOTE — Anesthesia Procedure Notes (Addendum)
Performed by: Marylene Buerger    Procedure Name: Intubation Date/Time: 10/26/2010 1:27 PM Performed by: Marylene Buerger Pre-anesthesia Checklist: Patient identified, Patient being monitored and Suction available Patient Re-evaluated:Patient Re-evaluated prior to inductionOxygen Delivery Method: Circle System Utilized Preoxygenation: Pre-oxygenation with 100% oxygen Intubation Type: IV induction Laryngoscope Size: Mac and 3 Grade View: Grade I Tube type: Oral Tube size: 7.0 mm Number of attempts: 1 Placement Confirmation: ETT inserted through vocal cords under direct vision,  positive ETCO2 and breath sounds checked- equal and bilateral Secured at: 22 cm Tube secured with: Tape Dental Injury: Teeth and Oropharynx as per pre-operative assessment

## 2010-10-26 NOTE — H&P (View-Only) (Signed)
Chief complaint: wrist pain  HPI:(4) 74 yo femal with c/o pain left  wrist s/p fall. Date of injury August 17, secondary to loose.  Complains of 3/10 intermittent pain improved with ibuprofen.  Mild swelling no numbness no tingling    ROS:(2) Nausea from pain medication but no real ALLERGIES all other systems reviewed are negative  PFSH: (1)  Past Medical History  Diagnosis Date  . Hypertension   . Hypercholesteremia   . Back pain, chronic    Past Surgical History  Procedure Date  . Cholecystectomy   . Right oophorectomy    History   Social History  . Marital Status: Divorced    Spouse Name: N/A    Number of Children: N/A  . Years of Education: N/A   Occupational History  . Not on file.   Social History Main Topics  . Smoking status: Never Smoker   . Smokeless tobacco: Not on file  . Alcohol Use: No  . Drug Use: No  . Sexually Active:    Other Topics Concern  . Not on file   Social History Narrative  . No narrative on file     Physical Exam(12) GENERAL: normal development   CDV: pulses are normal   Skin: normal  Lymph: nodes were not palpable/normal  Psychiatric: awake, alert and oriented  Neuro: normal sensation  MSK LEFT wrist 1 Tenderness and deformity of the LEFT upper extremity over the LEFT distal radius 2 Range of motion limited secondary to pain 3 Fingers gallbladder could not perform grip strength test 4 Stability normal  Lower extremity exam  Ambulation is normal.  Inspection and palpation revealed no tenderness or abnormality in alignment in the lower extremities. Range of motion is full.  Strength is grade 5. Joint stability normal   Imaging: xrays [facility] Angulated displaced 2 part distal radius fracture  Assessment: Based on the patient's age and activity level and general well being recommended open treatment internal fixation with volar plate    Plan: Open treatment internal fixation LEFT wrist with DVR plate  

## 2010-10-29 ENCOUNTER — Encounter: Payer: Self-pay | Admitting: Orthopedic Surgery

## 2010-10-29 ENCOUNTER — Ambulatory Visit (INDEPENDENT_AMBULATORY_CARE_PROVIDER_SITE_OTHER): Payer: Medicare Other | Admitting: Orthopedic Surgery

## 2010-10-29 DIAGNOSIS — Z9889 Other specified postprocedural states: Secondary | ICD-10-CM

## 2010-10-29 DIAGNOSIS — S62102A Fracture of unspecified carpal bone, left wrist, initial encounter for closed fracture: Secondary | ICD-10-CM | POA: Insufficient documentation

## 2010-10-29 DIAGNOSIS — S62109A Fracture of unspecified carpal bone, unspecified wrist, initial encounter for closed fracture: Secondary | ICD-10-CM

## 2010-10-29 NOTE — Patient Instructions (Signed)
Ice for swelling   Elevate as needed

## 2010-10-29 NOTE — Progress Notes (Signed)
Postop visit #  1  DOS Oct 26, 2010 (POD 3)   DX CLOSED FRX LEFT WRIST  Procedure otif dvr platte  Operative Findings 2 part distal radius fracture with metaphyseal comminutia  Complaints none  Exam: The splint is intact and the fingers have good mobility with minimal swelling  Plan Return for staples and sutures out in the 1st postoperative x-rays and application of splint or cast

## 2010-10-30 NOTE — OR Nursing (Signed)
30mL Bupivacaine 0.5% w/epinephrine 1:200,000 administered in operative site 10/26/10 by Dr Romeo Apple.  Unable to log info in meds section of intra-op chart

## 2010-10-31 ENCOUNTER — Encounter (HOSPITAL_COMMUNITY): Payer: Self-pay | Admitting: Orthopedic Surgery

## 2010-11-03 HISTORY — PX: WRIST FRACTURE SURGERY: SHX121

## 2010-11-08 ENCOUNTER — Encounter: Payer: Self-pay | Admitting: Orthopedic Surgery

## 2010-11-08 ENCOUNTER — Ambulatory Visit (INDEPENDENT_AMBULATORY_CARE_PROVIDER_SITE_OTHER): Payer: Medicare Other | Admitting: Orthopedic Surgery

## 2010-11-08 DIAGNOSIS — R52 Pain, unspecified: Secondary | ICD-10-CM

## 2010-11-08 MED ORDER — IBUPROFEN 800 MG PO TABS: 800.0000 mg | ORAL_TABLET | Freq: Three times a day (TID) | ORAL | Status: DC | PRN

## 2010-11-08 NOTE — Progress Notes (Signed)
Postop visit.  13 days after open treatment internal fixation, LEFT wrist with a DVR plate.  X-rays today. Staples out.  Wound is clean, dry, intact. Patient has excellent motion in her hand.  X-rays show excellent radial inclination. She is out to length. She has good volar tilt.  Plan the patient is placed in a removable splint and she will come back in 4 weeks for repeat x-ray and then we will start occupational therapy at separate x-ray report.  Separate x-ray report 3 views LEFT wrist.  Previously noted distal radius fractures in good alignment. There is a volar plate; nocomplicating features from the hardware.  Impression stable fixation, LEFT wrist status post internal fixation

## 2010-11-08 NOTE — Patient Instructions (Signed)
Wear brace except bathing  Move fingers as much as possible

## 2010-11-14 ENCOUNTER — Encounter: Payer: Self-pay | Admitting: Orthopedic Surgery

## 2010-11-14 ENCOUNTER — Ambulatory Visit (INDEPENDENT_AMBULATORY_CARE_PROVIDER_SITE_OTHER): Payer: Medicare Other | Admitting: Orthopedic Surgery

## 2010-11-14 VITALS — Ht 66.0 in | Wt 203.0 lb

## 2010-11-14 DIAGNOSIS — L03114 Cellulitis of left upper limb: Secondary | ICD-10-CM

## 2010-11-14 DIAGNOSIS — S62102A Fracture of unspecified carpal bone, left wrist, initial encounter for closed fracture: Secondary | ICD-10-CM | POA: Insufficient documentation

## 2010-11-14 DIAGNOSIS — L02519 Cutaneous abscess of unspecified hand: Secondary | ICD-10-CM

## 2010-11-14 DIAGNOSIS — S62109A Fracture of unspecified carpal bone, unspecified wrist, initial encounter for closed fracture: Secondary | ICD-10-CM

## 2010-11-14 DIAGNOSIS — IMO0002 Reserved for concepts with insufficient information to code with codable children: Secondary | ICD-10-CM

## 2010-11-14 MED ORDER — SULFAMETHOXAZOLE-TRIMETHOPRIM 800-160 MG PO TABS: 1.0000 | ORAL_TABLET | Freq: Two times a day (BID) | ORAL | Status: AC

## 2010-11-14 NOTE — Patient Instructions (Signed)
START ANTIBIOTICS   WEAR BRACE

## 2010-11-14 NOTE — Progress Notes (Signed)
Postoperative visit  Chief complaint drainage from the LEFT wrist  Surgery date August 24  Procedure open treatment internal fixation LEFT wrist with volar plate.  Currently the patient is in a removable Velcro wrist splint.  Reportedly had increased drainage.  Distal radius: incision is red around the edges   Start Bactrim 1 ds bid x 10 days with 1 refill check incision in 1 week

## 2010-11-21 ENCOUNTER — Encounter: Payer: Self-pay | Admitting: Orthopedic Surgery

## 2010-11-21 ENCOUNTER — Ambulatory Visit (INDEPENDENT_AMBULATORY_CARE_PROVIDER_SITE_OTHER): Payer: Medicare Other | Admitting: Orthopedic Surgery

## 2010-11-21 ENCOUNTER — Ambulatory Visit: Payer: Medicare Other | Admitting: Orthopedic Surgery

## 2010-11-21 DIAGNOSIS — L03119 Cellulitis of unspecified part of limb: Secondary | ICD-10-CM

## 2010-11-21 DIAGNOSIS — S62102A Fracture of unspecified carpal bone, left wrist, initial encounter for closed fracture: Secondary | ICD-10-CM

## 2010-11-21 DIAGNOSIS — L03114 Cellulitis of left upper limb: Secondary | ICD-10-CM

## 2010-11-21 DIAGNOSIS — L02519 Cutaneous abscess of unspecified hand: Secondary | ICD-10-CM

## 2010-11-21 DIAGNOSIS — Z9889 Other specified postprocedural states: Secondary | ICD-10-CM

## 2010-11-21 DIAGNOSIS — S62109A Fracture of unspecified carpal bone, unspecified wrist, initial encounter for closed fracture: Secondary | ICD-10-CM

## 2010-11-21 NOTE — Progress Notes (Signed)
Check Incision  DVR plate LEFT wrist  Date of surgery August 24  Small 1 mm opening of the incision had the distal portion with fibrinous exudate no surrounding erythema possible contact dermatitis from Band-Aid  Recommend continue Neosporin, covered with a dressing.  Continue brace.  Continue Bactrim.  Back one week check incision

## 2010-11-21 NOTE — Patient Instructions (Signed)
Continue medication and brace

## 2010-11-22 ENCOUNTER — Ambulatory Visit: Payer: Medicare Other | Admitting: Orthopedic Surgery

## 2010-11-29 ENCOUNTER — Encounter: Payer: Self-pay | Admitting: Orthopedic Surgery

## 2010-11-29 ENCOUNTER — Ambulatory Visit (INDEPENDENT_AMBULATORY_CARE_PROVIDER_SITE_OTHER): Payer: Medicare Other | Admitting: Orthopedic Surgery

## 2010-11-29 VITALS — Ht 66.0 in | Wt 203.0 lb

## 2010-11-29 DIAGNOSIS — S62109A Fracture of unspecified carpal bone, unspecified wrist, initial encounter for closed fracture: Secondary | ICD-10-CM

## 2010-11-29 MED ORDER — SULFAMETHOXAZOLE-TRIMETHOPRIM 800-160 MG PO TABS: 1.0000 | ORAL_TABLET | Freq: Two times a day (BID) | ORAL | Status: DC

## 2010-11-29 NOTE — Patient Instructions (Signed)
Brace   Ointment  Band aid

## 2010-11-29 NOTE — Progress Notes (Signed)
Wound check   Wound clean   Continue current treatment

## 2010-12-06 ENCOUNTER — Ambulatory Visit (INDEPENDENT_AMBULATORY_CARE_PROVIDER_SITE_OTHER): Payer: Medicare Other | Admitting: Orthopedic Surgery

## 2010-12-06 ENCOUNTER — Encounter: Payer: Self-pay | Admitting: Orthopedic Surgery

## 2010-12-06 DIAGNOSIS — S62109A Fracture of unspecified carpal bone, unspecified wrist, initial encounter for closed fracture: Secondary | ICD-10-CM

## 2010-12-06 DIAGNOSIS — S62102A Fracture of unspecified carpal bone, left wrist, initial encounter for closed fracture: Secondary | ICD-10-CM

## 2010-12-06 NOTE — Progress Notes (Signed)
6 weeks post op f/u   Left wrist fracture OTIF  DVR plate   Wound drained a little period treated with antibiotics topically covering the wound with a dressing  X-rays today  Patient reports no significant complaints  X-rays show fractures aligned properly and Internal fixation is in good position  Start exercises continued brace for 6 weeks x-ray in 6 weeks  Separate x-ray report 3 views LEFT wrist/X-ray follow-up after surgery  The fracture is aligned properly the hardware is in good position the fracture is healing properly  Impression healing distal radius fracture with volar plating

## 2010-12-06 NOTE — Patient Instructions (Signed)
Exercise 20 minutes 3 x a day   Wear brace 6 week s

## 2010-12-18 ENCOUNTER — Other Ambulatory Visit (HOSPITAL_COMMUNITY): Payer: Self-pay | Admitting: Internal Medicine

## 2010-12-18 DIAGNOSIS — Z139 Encounter for screening, unspecified: Secondary | ICD-10-CM

## 2010-12-27 ENCOUNTER — Ambulatory Visit (HOSPITAL_COMMUNITY)
Admission: RE | Admit: 2010-12-27 | Discharge: 2010-12-27 | Disposition: A | Discharge status: Home / Self Care | Payer: Medicare Other | Source: Ambulatory Visit | Attending: Internal Medicine | Admitting: Internal Medicine

## 2010-12-27 DIAGNOSIS — Z1231 Encounter for screening mammogram for malignant neoplasm of breast: Secondary | ICD-10-CM | POA: Insufficient documentation

## 2010-12-27 DIAGNOSIS — Z139 Encounter for screening, unspecified: Secondary | ICD-10-CM

## 2011-01-17 ENCOUNTER — Encounter: Payer: Self-pay | Admitting: Orthopedic Surgery

## 2011-01-17 ENCOUNTER — Ambulatory Visit (INDEPENDENT_AMBULATORY_CARE_PROVIDER_SITE_OTHER): Payer: Medicare Other | Admitting: Orthopedic Surgery

## 2011-01-17 VITALS — Ht 66.0 in | Wt 191.0 lb

## 2011-01-17 DIAGNOSIS — M1990 Unspecified osteoarthritis, unspecified site: Secondary | ICD-10-CM | POA: Insufficient documentation

## 2011-01-17 DIAGNOSIS — M129 Arthropathy, unspecified: Secondary | ICD-10-CM

## 2011-01-17 DIAGNOSIS — S62109A Fracture of unspecified carpal bone, unspecified wrist, initial encounter for closed fracture: Secondary | ICD-10-CM

## 2011-01-17 MED ORDER — DICLOFENAC SODIUM 50 MG PO TBEC: 50.0000 mg | DELAYED_RELEASE_TABLET | Freq: Two times a day (BID) | ORAL | Status: DC

## 2011-01-17 NOTE — Patient Instructions (Signed)
Start voltaren

## 2011-01-17 NOTE — Progress Notes (Signed)
Postoperative visit for x-raysLeft wrist fracture OTIF  DVR plate   X-rays today  Patient reports no significant complaints   X-rays show fractures aligned properly and Internal fixation is in good position   Her wound is clean dry and intact and completely healed.  She has 20 of extension and 25 of flexion at the wrist joint.  No hindrance or restriction with pronation supination    Separate x-ray report 3 views LEFT wrist/X-ray follow-up after surgery  The fracture is aligned properly the hardware is in good position the fracture is healing properly  Impression healing distal radius fracture with volar plating  Okay to remove brace follow-up as needed

## 2011-03-11 DIAGNOSIS — E039 Hypothyroidism, unspecified: Secondary | ICD-10-CM | POA: Diagnosis not present

## 2011-03-18 DIAGNOSIS — E039 Hypothyroidism, unspecified: Secondary | ICD-10-CM | POA: Diagnosis not present

## 2011-03-18 DIAGNOSIS — I10 Essential (primary) hypertension: Secondary | ICD-10-CM | POA: Diagnosis not present

## 2011-04-02 ENCOUNTER — Other Ambulatory Visit: Payer: Self-pay | Admitting: Orthopedic Surgery

## 2011-04-17 ENCOUNTER — Other Ambulatory Visit: Payer: Self-pay | Admitting: Neurosurgery

## 2011-04-17 DIAGNOSIS — M47816 Spondylosis without myelopathy or radiculopathy, lumbar region: Secondary | ICD-10-CM

## 2011-04-25 ENCOUNTER — Other Ambulatory Visit: Payer: Medicare Other

## 2011-05-03 ENCOUNTER — Ambulatory Visit
Admission: RE | Admit: 2011-05-03 | Discharge: 2011-05-03 | Disposition: A | Discharge status: Home / Self Care | Payer: Medicare Other | Source: Ambulatory Visit | Attending: Neurosurgery | Admitting: Neurosurgery

## 2011-05-03 DIAGNOSIS — M5126 Other intervertebral disc displacement, lumbar region: Secondary | ICD-10-CM | POA: Diagnosis not present

## 2011-05-03 DIAGNOSIS — M47816 Spondylosis without myelopathy or radiculopathy, lumbar region: Secondary | ICD-10-CM

## 2011-07-30 ENCOUNTER — Other Ambulatory Visit: Payer: Self-pay | Admitting: Orthopedic Surgery

## 2011-07-31 ENCOUNTER — Ambulatory Visit (INDEPENDENT_AMBULATORY_CARE_PROVIDER_SITE_OTHER): Payer: Medicare Other

## 2011-07-31 ENCOUNTER — Encounter: Payer: Self-pay | Admitting: Orthopedic Surgery

## 2011-07-31 ENCOUNTER — Ambulatory Visit (INDEPENDENT_AMBULATORY_CARE_PROVIDER_SITE_OTHER): Payer: Medicare Other | Admitting: Orthopedic Surgery

## 2011-07-31 VITALS — BP 140/60 | Ht 66.0 in | Wt 187.0 lb

## 2011-07-31 DIAGNOSIS — M79609 Pain in unspecified limb: Secondary | ICD-10-CM

## 2011-07-31 DIAGNOSIS — M6789 Other specified disorders of synovium and tendon, multiple sites: Secondary | ICD-10-CM | POA: Diagnosis not present

## 2011-07-31 DIAGNOSIS — M76829 Posterior tibial tendinitis, unspecified leg: Secondary | ICD-10-CM | POA: Insufficient documentation

## 2011-07-31 DIAGNOSIS — M79673 Pain in unspecified foot: Secondary | ICD-10-CM

## 2011-07-31 NOTE — Patient Instructions (Signed)
Wear boot x 6 weeks or  Until pain subsides

## 2011-07-31 NOTE — Progress Notes (Signed)
Subjective:     Patient ID: Kelli Gregory, female   DOB: 02/06/1936, 76 y.o.   MRN: [id number] Chief Complaint  Patient presents with  . Foot Pain    Left foot pain, xrays today.    Foot Pain This is a chronic problem. The current episode started more than 1 month ago. The problem occurs constantly. The problem has been gradually worsening. Pertinent negatives include no numbness or weakness. The symptoms are aggravated by walking. She has tried nothing for the symptoms.     Review of Systems  Neurological: Negative for weakness and numbness.       Objective:   Physical Exam Physical Exam(12) GENERAL: normal development   CDV: pulses are normal   Skin: normal  Lymph: nodes were not palpable/normal  Psychiatric: awake, alert and oriented  Neuro: normal sensation  MSK antalgic gait 1 right foot: severe flat foot, 2nd crossover toe, bunion, lateral calcaneocuboid impingement and tenderness 2 ankle ROM DF 20 PF 20  3 ankle stable  4 flat foot deformity , too many toes sign, poor lift off  5 PTT WEAKNESS    Assessment: PPTD WITH LATERAL IMPINGEMENT     Plan: BOOT X 6 WEEKS

## 2011-08-16 DIAGNOSIS — F329 Major depressive disorder, single episode, unspecified: Secondary | ICD-10-CM | POA: Diagnosis not present

## 2011-08-16 DIAGNOSIS — I10 Essential (primary) hypertension: Secondary | ICD-10-CM | POA: Diagnosis not present

## 2011-08-20 ENCOUNTER — Other Ambulatory Visit: Payer: Self-pay | Admitting: Neurosurgery

## 2011-08-20 DIAGNOSIS — M47816 Spondylosis without myelopathy or radiculopathy, lumbar region: Secondary | ICD-10-CM

## 2011-08-28 ENCOUNTER — Other Ambulatory Visit: Payer: Medicare Other

## 2011-09-04 ENCOUNTER — Ambulatory Visit
Admission: RE | Admit: 2011-09-04 | Discharge: 2011-09-04 | Disposition: A | Discharge status: Home / Self Care | Payer: Medicare Other | Source: Ambulatory Visit | Attending: Neurosurgery | Admitting: Neurosurgery

## 2011-09-04 DIAGNOSIS — M47817 Spondylosis without myelopathy or radiculopathy, lumbosacral region: Secondary | ICD-10-CM | POA: Diagnosis not present

## 2011-09-04 DIAGNOSIS — M47816 Spondylosis without myelopathy or radiculopathy, lumbar region: Secondary | ICD-10-CM

## 2011-09-04 MED ORDER — METHYLPREDNISOLONE ACETATE 40 MG/ML INJ SUSP (RADIOLOG
120.0000 mg | Freq: Once | INTRAMUSCULAR | Status: AC
  Administered 2011-09-04: 120 mg via EPIDURAL

## 2011-09-04 MED ORDER — IOHEXOL 180 MG/ML  SOLN
1.0000 mL | Freq: Once | INTRAMUSCULAR | Status: AC | PRN
  Administered 2011-09-04: 1 mL via EPIDURAL

## 2011-09-11 ENCOUNTER — Encounter: Payer: Self-pay | Admitting: Orthopedic Surgery

## 2011-09-11 ENCOUNTER — Ambulatory Visit (INDEPENDENT_AMBULATORY_CARE_PROVIDER_SITE_OTHER): Payer: Medicare Other | Admitting: Orthopedic Surgery

## 2011-09-11 VITALS — BP 122/68 | Ht 66.0 in | Wt 183.0 lb

## 2011-09-11 DIAGNOSIS — M659 Synovitis and tenosynovitis, unspecified: Secondary | ICD-10-CM | POA: Diagnosis not present

## 2011-09-11 NOTE — Patient Instructions (Signed)
Wear brace a few more weeks

## 2011-09-11 NOTE — Progress Notes (Signed)
Patient ID: Kelli Gregory, female   DOB: [birth year], 76 y.o.   MRN: [id number] Chief Complaint  Patient presents with  . Follow-up    6 week recheck Left foot     History of clear tibial tendon disorder  Chronic  She has really a grade 4 type deformity in the foot possibly grade 3  Short Cam Walker did well pain has improved still has some lateral impingement pain from the nutcracker effect  Continued for a couple more weeks then wean off of the brace followup as needed

## 2011-11-06 DIAGNOSIS — M47817 Spondylosis without myelopathy or radiculopathy, lumbosacral region: Secondary | ICD-10-CM | POA: Diagnosis not present

## 2011-11-07 ENCOUNTER — Other Ambulatory Visit: Payer: Self-pay | Admitting: Neurosurgery

## 2011-11-07 DIAGNOSIS — M47816 Spondylosis without myelopathy or radiculopathy, lumbar region: Secondary | ICD-10-CM

## 2011-11-11 ENCOUNTER — Other Ambulatory Visit: Payer: Self-pay | Admitting: Neurosurgery

## 2011-11-11 ENCOUNTER — Ambulatory Visit
Admission: RE | Admit: 2011-11-11 | Discharge: 2011-11-11 | Disposition: A | Discharge status: Home / Self Care | Payer: Medicare Other | Source: Ambulatory Visit | Attending: Neurosurgery | Admitting: Neurosurgery

## 2011-11-11 DIAGNOSIS — M47817 Spondylosis without myelopathy or radiculopathy, lumbosacral region: Secondary | ICD-10-CM | POA: Diagnosis not present

## 2011-11-11 DIAGNOSIS — M47816 Spondylosis without myelopathy or radiculopathy, lumbar region: Secondary | ICD-10-CM

## 2011-11-11 DIAGNOSIS — M8569 Other cyst of bone, multiple sites: Secondary | ICD-10-CM | POA: Diagnosis not present

## 2011-11-11 DIAGNOSIS — M5126 Other intervertebral disc displacement, lumbar region: Secondary | ICD-10-CM | POA: Diagnosis not present

## 2011-11-11 MED ORDER — METHYLPREDNISOLONE ACETATE 40 MG/ML INJ SUSP (RADIOLOG: 120.0000 mg | Freq: Once | INTRAMUSCULAR | Status: DC

## 2011-11-11 MED ORDER — IOHEXOL 180 MG/ML  SOLN: 1.0000 mL | Freq: Once | INTRAMUSCULAR | Status: AC | PRN

## 2011-12-25 DIAGNOSIS — Z79899 Other long term (current) drug therapy: Secondary | ICD-10-CM | POA: Diagnosis not present

## 2011-12-25 DIAGNOSIS — I10 Essential (primary) hypertension: Secondary | ICD-10-CM | POA: Diagnosis not present

## 2011-12-25 DIAGNOSIS — E785 Hyperlipidemia, unspecified: Secondary | ICD-10-CM | POA: Diagnosis not present

## 2011-12-25 DIAGNOSIS — E039 Hypothyroidism, unspecified: Secondary | ICD-10-CM | POA: Diagnosis not present

## 2012-01-02 DIAGNOSIS — I4949 Other premature depolarization: Secondary | ICD-10-CM | POA: Diagnosis not present

## 2012-01-02 DIAGNOSIS — E039 Hypothyroidism, unspecified: Secondary | ICD-10-CM | POA: Diagnosis not present

## 2012-01-02 DIAGNOSIS — Z23 Encounter for immunization: Secondary | ICD-10-CM | POA: Diagnosis not present

## 2012-01-02 DIAGNOSIS — E785 Hyperlipidemia, unspecified: Secondary | ICD-10-CM | POA: Diagnosis not present

## 2012-01-02 DIAGNOSIS — I10 Essential (primary) hypertension: Secondary | ICD-10-CM | POA: Diagnosis not present

## 2012-02-05 ENCOUNTER — Telehealth: Payer: Self-pay | Admitting: Radiology

## 2012-02-05 ENCOUNTER — Other Ambulatory Visit: Payer: Self-pay | Admitting: Orthopedic Surgery

## 2012-02-05 DIAGNOSIS — M1990 Unspecified osteoarthritis, unspecified site: Secondary | ICD-10-CM

## 2012-02-05 DIAGNOSIS — M19019 Primary osteoarthritis, unspecified shoulder: Secondary | ICD-10-CM

## 2012-02-05 MED ORDER — DICLOFENAC SODIUM 50 MG PO TBEC: 50.0000 mg | DELAYED_RELEASE_TABLET | Freq: Two times a day (BID) | ORAL | Status: DC

## 2012-02-05 NOTE — Telephone Encounter (Signed)
She needs her Diclofenac refilled. Her pharmacy is [facility] [place]. She did not need the Ibuprofen

## 2012-02-19 DIAGNOSIS — M47817 Spondylosis without myelopathy or radiculopathy, lumbosacral region: Secondary | ICD-10-CM | POA: Diagnosis not present

## 2012-06-01 DIAGNOSIS — I10 Essential (primary) hypertension: Secondary | ICD-10-CM | POA: Diagnosis not present

## 2012-06-01 DIAGNOSIS — F329 Major depressive disorder, single episode, unspecified: Secondary | ICD-10-CM | POA: Diagnosis not present

## 2012-06-18 ENCOUNTER — Other Ambulatory Visit (HOSPITAL_COMMUNITY): Payer: Self-pay | Admitting: Internal Medicine

## 2012-06-18 DIAGNOSIS — Z139 Encounter for screening, unspecified: Secondary | ICD-10-CM

## 2012-06-25 ENCOUNTER — Ambulatory Visit (HOSPITAL_COMMUNITY)
Admission: RE | Admit: 2012-06-25 | Discharge: 2012-06-25 | Disposition: A | Discharge status: Home / Self Care | Payer: Medicare Other | Source: Ambulatory Visit | Attending: Internal Medicine | Admitting: Internal Medicine

## 2012-06-25 DIAGNOSIS — Z1231 Encounter for screening mammogram for malignant neoplasm of breast: Secondary | ICD-10-CM | POA: Insufficient documentation

## 2012-06-25 DIAGNOSIS — Z139 Encounter for screening, unspecified: Secondary | ICD-10-CM

## 2012-07-13 DIAGNOSIS — J069 Acute upper respiratory infection, unspecified: Secondary | ICD-10-CM | POA: Diagnosis not present

## 2012-07-13 DIAGNOSIS — J039 Acute tonsillitis, unspecified: Secondary | ICD-10-CM | POA: Diagnosis not present

## 2012-07-13 DIAGNOSIS — J209 Acute bronchitis, unspecified: Secondary | ICD-10-CM | POA: Diagnosis not present

## 2012-07-17 DIAGNOSIS — J209 Acute bronchitis, unspecified: Secondary | ICD-10-CM | POA: Diagnosis not present

## 2012-07-17 DIAGNOSIS — J069 Acute upper respiratory infection, unspecified: Secondary | ICD-10-CM | POA: Diagnosis not present

## 2012-08-14 DIAGNOSIS — L03319 Cellulitis of trunk, unspecified: Secondary | ICD-10-CM | POA: Diagnosis not present

## 2012-08-14 DIAGNOSIS — L02219 Cutaneous abscess of trunk, unspecified: Secondary | ICD-10-CM | POA: Diagnosis not present

## 2012-09-16 ENCOUNTER — Ambulatory Visit (INDEPENDENT_AMBULATORY_CARE_PROVIDER_SITE_OTHER): Payer: Medicare Other | Admitting: Orthopedic Surgery

## 2012-09-16 ENCOUNTER — Encounter: Payer: Self-pay | Admitting: Orthopedic Surgery

## 2012-09-16 ENCOUNTER — Ambulatory Visit (INDEPENDENT_AMBULATORY_CARE_PROVIDER_SITE_OTHER): Payer: Medicare Other

## 2012-09-16 VITALS — BP 158/80 | Ht 66.0 in | Wt 187.0 lb

## 2012-09-16 DIAGNOSIS — M67919 Unspecified disorder of synovium and tendon, unspecified shoulder: Secondary | ICD-10-CM | POA: Diagnosis not present

## 2012-09-16 DIAGNOSIS — M719 Bursopathy, unspecified: Secondary | ICD-10-CM

## 2012-09-16 DIAGNOSIS — M19019 Primary osteoarthritis, unspecified shoulder: Secondary | ICD-10-CM | POA: Insufficient documentation

## 2012-09-16 DIAGNOSIS — M25319 Other instability, unspecified shoulder: Secondary | ICD-10-CM | POA: Insufficient documentation

## 2012-09-16 DIAGNOSIS — M25519 Pain in unspecified shoulder: Secondary | ICD-10-CM | POA: Insufficient documentation

## 2012-09-16 DIAGNOSIS — M25511 Pain in right shoulder: Secondary | ICD-10-CM

## 2012-09-16 DIAGNOSIS — M25311 Other instability, right shoulder: Secondary | ICD-10-CM

## 2012-09-16 NOTE — Patient Instructions (Addendum)
You have received a steroid shot. 15% of patients experience increased pain at the injection site with in the next 24 hours. This is best treated with ice and tylenol extra strength 2 tabs every 8 hours. If you are still having pain please call the office.   You have a rotator cuff tear it is a chronic tear which has now progressed to rotator cuff induced arthritis the treatment for this type of problem is a shoulder replacement  You can improve your condition with shoulder exercises which have been included.

## 2012-09-16 NOTE — Progress Notes (Signed)
  Subjective:    Kelli Gregory is a 77 y.o. female who presents with Pain over the right arm near the deltoid without shoulder pain. The symptoms began 2 months ago. Aggravating factors: no known event. Pain is located Near the deltoid insertion. Discomfort is described as throbbing. Symptoms are exacerbated by Nothing specific. Evaluation to date: none. Therapy to date includes: nothing specific.  The following portions of the patient's history were reviewed and updated as appropriate: allergies, current medications, past family history, past medical history, past social history, past surgical history and problem list.  Review of Systems A comprehensive review of systems was negative.   Objective:    BP 158/80  Ht 5\' 6"  (1.676 m)  Wt 187 lb (84.823 kg)  BMI 30.2 kg/m2 Right shoulder: There is tenderness over the right arm near the deltoid insertion there appears to be warmth around the deltoid she is nontender in the posterior cuff anterior joint line or rotator interval the biceps is nontender.. Passive range of motion is normal with crepitance in the subacromial space there is definite weakness in the supraspinatus tendon the shoulder is otherwise stable her impingement sign was negative by the Neer criteria and Hawkins maneuver  Left shoulder: normal active ROM, no tenderness, no impingement sign and Rotator cuff strength was normal    General appearance is normal, the patient is alert and oriented x3 with normal mood and affect.  Assessment:    Right rotator cuff tendinitis, shoulder pain, Possible arthritis possible rotator cuff insufficiency possible chronic rotator cuff tear    Plan:    Shoulder injection. See procedure note.

## 2012-12-28 DIAGNOSIS — I10 Essential (primary) hypertension: Secondary | ICD-10-CM | POA: Diagnosis not present

## 2012-12-28 DIAGNOSIS — Z79899 Other long term (current) drug therapy: Secondary | ICD-10-CM | POA: Diagnosis not present

## 2012-12-28 DIAGNOSIS — E039 Hypothyroidism, unspecified: Secondary | ICD-10-CM | POA: Diagnosis not present

## 2012-12-28 DIAGNOSIS — E785 Hyperlipidemia, unspecified: Secondary | ICD-10-CM | POA: Diagnosis not present

## 2013-01-04 DIAGNOSIS — Z23 Encounter for immunization: Secondary | ICD-10-CM | POA: Diagnosis not present

## 2013-01-04 DIAGNOSIS — Z0000 Encounter for general adult medical examination without abnormal findings: Secondary | ICD-10-CM | POA: Diagnosis not present

## 2013-06-21 ENCOUNTER — Ambulatory Visit: Payer: Medicare Other | Admitting: Orthopedic Surgery

## 2013-07-05 DIAGNOSIS — E785 Hyperlipidemia, unspecified: Secondary | ICD-10-CM | POA: Diagnosis not present

## 2013-07-05 DIAGNOSIS — F3289 Other specified depressive episodes: Secondary | ICD-10-CM | POA: Diagnosis not present

## 2013-07-05 DIAGNOSIS — F329 Major depressive disorder, single episode, unspecified: Secondary | ICD-10-CM | POA: Diagnosis not present

## 2013-07-05 DIAGNOSIS — I10 Essential (primary) hypertension: Secondary | ICD-10-CM | POA: Diagnosis not present

## 2013-07-14 ENCOUNTER — Other Ambulatory Visit: Payer: Self-pay | Admitting: Neurosurgery

## 2013-07-14 DIAGNOSIS — M47816 Spondylosis without myelopathy or radiculopathy, lumbar region: Secondary | ICD-10-CM

## 2013-07-19 ENCOUNTER — Ambulatory Visit
Admission: RE | Admit: 2013-07-19 | Discharge: 2013-07-19 | Disposition: A | Discharge status: Home / Self Care | Payer: Medicare Other | Source: Ambulatory Visit | Attending: Neurosurgery | Admitting: Neurosurgery

## 2013-07-19 DIAGNOSIS — M47816 Spondylosis without myelopathy or radiculopathy, lumbar region: Secondary | ICD-10-CM

## 2013-07-19 DIAGNOSIS — IMO0002 Reserved for concepts with insufficient information to code with codable children: Secondary | ICD-10-CM | POA: Diagnosis not present

## 2013-07-19 MED ORDER — METHYLPREDNISOLONE ACETATE 40 MG/ML INJ SUSP (RADIOLOG
120.0000 mg | Freq: Once | INTRAMUSCULAR | Status: AC
  Administered 2013-07-19: 120 mg via EPIDURAL

## 2013-07-19 MED ORDER — IOHEXOL 180 MG/ML  SOLN
1.0000 mL | Freq: Once | INTRAMUSCULAR | Status: AC | PRN
  Administered 2013-07-19: 1 mL via EPIDURAL

## 2013-07-19 NOTE — Discharge Instructions (Signed)

## 2013-07-27 DIAGNOSIS — M47817 Spondylosis without myelopathy or radiculopathy, lumbosacral region: Secondary | ICD-10-CM | POA: Diagnosis not present

## 2013-08-25 DIAGNOSIS — E785 Hyperlipidemia, unspecified: Secondary | ICD-10-CM | POA: Diagnosis not present

## 2013-09-01 DIAGNOSIS — I10 Essential (primary) hypertension: Secondary | ICD-10-CM | POA: Diagnosis not present

## 2013-09-01 DIAGNOSIS — E785 Hyperlipidemia, unspecified: Secondary | ICD-10-CM | POA: Diagnosis not present

## 2013-09-01 DIAGNOSIS — N390 Urinary tract infection, site not specified: Secondary | ICD-10-CM | POA: Diagnosis not present

## 2013-09-01 DIAGNOSIS — F3289 Other specified depressive episodes: Secondary | ICD-10-CM | POA: Diagnosis not present

## 2013-09-01 DIAGNOSIS — F329 Major depressive disorder, single episode, unspecified: Secondary | ICD-10-CM | POA: Diagnosis not present

## 2013-10-11 DIAGNOSIS — R109 Unspecified abdominal pain: Secondary | ICD-10-CM | POA: Diagnosis not present

## 2013-10-11 DIAGNOSIS — R1031 Right lower quadrant pain: Secondary | ICD-10-CM | POA: Diagnosis not present

## 2013-10-26 DIAGNOSIS — F3289 Other specified depressive episodes: Secondary | ICD-10-CM | POA: Diagnosis not present

## 2013-10-26 DIAGNOSIS — I10 Essential (primary) hypertension: Secondary | ICD-10-CM | POA: Diagnosis not present

## 2013-10-26 DIAGNOSIS — F329 Major depressive disorder, single episode, unspecified: Secondary | ICD-10-CM | POA: Diagnosis not present

## 2013-11-03 DIAGNOSIS — M47817 Spondylosis without myelopathy or radiculopathy, lumbosacral region: Secondary | ICD-10-CM | POA: Diagnosis not present

## 2013-11-17 DIAGNOSIS — H6690 Otitis media, unspecified, unspecified ear: Secondary | ICD-10-CM | POA: Diagnosis not present

## 2013-11-17 DIAGNOSIS — J309 Allergic rhinitis, unspecified: Secondary | ICD-10-CM | POA: Diagnosis not present

## 2013-12-17 DIAGNOSIS — H9201 Otalgia, right ear: Secondary | ICD-10-CM | POA: Diagnosis not present

## 2013-12-17 DIAGNOSIS — J301 Allergic rhinitis due to pollen: Secondary | ICD-10-CM | POA: Diagnosis not present

## 2013-12-22 ENCOUNTER — Other Ambulatory Visit (HOSPITAL_COMMUNITY): Payer: Self-pay | Admitting: Internal Medicine

## 2013-12-22 DIAGNOSIS — Z1231 Encounter for screening mammogram for malignant neoplasm of breast: Secondary | ICD-10-CM

## 2014-01-03 ENCOUNTER — Ambulatory Visit (HOSPITAL_COMMUNITY)
Admission: RE | Admit: 2014-01-03 | Discharge: 2014-01-03 | Disposition: A | Discharge status: Home / Self Care | Payer: Medicare Other | Source: Ambulatory Visit | Attending: Internal Medicine | Admitting: Internal Medicine

## 2014-01-03 DIAGNOSIS — Z23 Encounter for immunization: Secondary | ICD-10-CM | POA: Diagnosis not present

## 2014-01-03 DIAGNOSIS — E785 Hyperlipidemia, unspecified: Secondary | ICD-10-CM | POA: Diagnosis not present

## 2014-01-03 DIAGNOSIS — M1990 Unspecified osteoarthritis, unspecified site: Secondary | ICD-10-CM | POA: Diagnosis not present

## 2014-01-03 DIAGNOSIS — E039 Hypothyroidism, unspecified: Secondary | ICD-10-CM | POA: Diagnosis not present

## 2014-01-03 DIAGNOSIS — Z79899 Other long term (current) drug therapy: Secondary | ICD-10-CM | POA: Diagnosis not present

## 2014-01-03 DIAGNOSIS — Z1231 Encounter for screening mammogram for malignant neoplasm of breast: Secondary | ICD-10-CM | POA: Diagnosis not present

## 2014-01-06 ENCOUNTER — Other Ambulatory Visit: Payer: Self-pay | Admitting: Internal Medicine

## 2014-01-06 DIAGNOSIS — R928 Other abnormal and inconclusive findings on diagnostic imaging of breast: Secondary | ICD-10-CM

## 2014-01-13 ENCOUNTER — Other Ambulatory Visit (HOSPITAL_COMMUNITY): Payer: Self-pay | Admitting: Internal Medicine

## 2014-01-13 DIAGNOSIS — I10 Essential (primary) hypertension: Secondary | ICD-10-CM | POA: Diagnosis not present

## 2014-01-13 DIAGNOSIS — E785 Hyperlipidemia, unspecified: Secondary | ICD-10-CM | POA: Diagnosis not present

## 2014-01-13 DIAGNOSIS — R109 Unspecified abdominal pain: Secondary | ICD-10-CM

## 2014-01-13 DIAGNOSIS — E039 Hypothyroidism, unspecified: Secondary | ICD-10-CM | POA: Diagnosis not present

## 2014-01-18 ENCOUNTER — Ambulatory Visit (HOSPITAL_COMMUNITY)
Admission: RE | Admit: 2014-01-18 | Discharge: 2014-01-18 | Disposition: A | Discharge status: Home / Self Care | Payer: Medicare Other | Source: Ambulatory Visit | Attending: Internal Medicine | Admitting: Internal Medicine

## 2014-01-18 DIAGNOSIS — K7689 Other specified diseases of liver: Secondary | ICD-10-CM | POA: Diagnosis not present

## 2014-01-18 DIAGNOSIS — N8320 Unspecified ovarian cysts: Secondary | ICD-10-CM | POA: Diagnosis not present

## 2014-01-18 DIAGNOSIS — I10 Essential (primary) hypertension: Secondary | ICD-10-CM | POA: Insufficient documentation

## 2014-01-18 DIAGNOSIS — K5730 Diverticulosis of large intestine without perforation or abscess without bleeding: Secondary | ICD-10-CM | POA: Diagnosis not present

## 2014-01-18 DIAGNOSIS — R1031 Right lower quadrant pain: Secondary | ICD-10-CM | POA: Diagnosis not present

## 2014-01-18 DIAGNOSIS — K449 Diaphragmatic hernia without obstruction or gangrene: Secondary | ICD-10-CM | POA: Diagnosis not present

## 2014-01-18 DIAGNOSIS — R109 Unspecified abdominal pain: Secondary | ICD-10-CM

## 2014-01-18 MED ORDER — IOHEXOL 300 MG/ML  SOLN
100.0000 mL | Freq: Once | INTRAMUSCULAR | Status: AC | PRN
Start: 2014-01-18 — End: 2014-01-18
  Administered 2014-01-18: 100 mL via INTRAVENOUS

## 2014-01-18 MED ORDER — SODIUM CHLORIDE 0.9 % IJ SOLN
INTRAMUSCULAR | Status: AC
  Filled 2014-01-18: qty 500

## 2014-01-25 ENCOUNTER — Ambulatory Visit (HOSPITAL_COMMUNITY)
Admission: RE | Admit: 2014-01-25 | Discharge: 2014-01-25 | Disposition: A | Discharge status: Home / Self Care | Payer: Medicare Other | Source: Ambulatory Visit | Attending: Internal Medicine | Admitting: Internal Medicine

## 2014-01-25 DIAGNOSIS — R928 Other abnormal and inconclusive findings on diagnostic imaging of breast: Secondary | ICD-10-CM | POA: Insufficient documentation

## 2014-04-11 DIAGNOSIS — E039 Hypothyroidism, unspecified: Secondary | ICD-10-CM | POA: Diagnosis not present

## 2014-06-24 DIAGNOSIS — I700 Atherosclerosis of aorta: Secondary | ICD-10-CM | POA: Diagnosis not present

## 2014-06-24 DIAGNOSIS — E039 Hypothyroidism, unspecified: Secondary | ICD-10-CM | POA: Diagnosis not present

## 2014-06-24 DIAGNOSIS — F339 Major depressive disorder, recurrent, unspecified: Secondary | ICD-10-CM | POA: Diagnosis not present

## 2014-08-04 ENCOUNTER — Other Ambulatory Visit (HOSPITAL_COMMUNITY): Payer: Self-pay | Admitting: Internal Medicine

## 2014-08-04 DIAGNOSIS — R922 Inconclusive mammogram: Secondary | ICD-10-CM

## 2014-08-05 DIAGNOSIS — F339 Major depressive disorder, recurrent, unspecified: Secondary | ICD-10-CM | POA: Diagnosis not present

## 2014-08-05 DIAGNOSIS — Z87440 Personal history of urinary (tract) infections: Secondary | ICD-10-CM | POA: Diagnosis not present

## 2014-08-09 ENCOUNTER — Encounter (HOSPITAL_COMMUNITY): Payer: PRIVATE HEALTH INSURANCE

## 2014-09-06 ENCOUNTER — Ambulatory Visit (HOSPITAL_COMMUNITY)
Admission: RE | Admit: 2014-09-06 | Discharge: 2014-09-06 | Disposition: A | Discharge status: Home / Self Care | Payer: Medicare Other | Source: Ambulatory Visit | Attending: Internal Medicine | Admitting: Internal Medicine

## 2014-09-06 DIAGNOSIS — R928 Other abnormal and inconclusive findings on diagnostic imaging of breast: Secondary | ICD-10-CM | POA: Insufficient documentation

## 2014-09-06 DIAGNOSIS — R922 Inconclusive mammogram: Secondary | ICD-10-CM

## 2014-09-22 DIAGNOSIS — F3340 Major depressive disorder, recurrent, in remission, unspecified: Secondary | ICD-10-CM | POA: Diagnosis not present

## 2014-09-22 DIAGNOSIS — Z6832 Body mass index (BMI) 32.0-32.9, adult: Secondary | ICD-10-CM | POA: Diagnosis not present

## 2014-09-22 DIAGNOSIS — E785 Hyperlipidemia, unspecified: Secondary | ICD-10-CM | POA: Diagnosis not present

## 2014-12-28 ENCOUNTER — Other Ambulatory Visit (HOSPITAL_COMMUNITY): Payer: Self-pay | Admitting: Internal Medicine

## 2014-12-28 DIAGNOSIS — R922 Inconclusive mammogram: Secondary | ICD-10-CM

## 2015-01-10 ENCOUNTER — Ambulatory Visit (HOSPITAL_COMMUNITY)
Admission: RE | Admit: 2015-01-10 | Discharge: 2015-01-10 | Disposition: A | Discharge status: Home / Self Care | Payer: Medicare Other | Source: Ambulatory Visit | Attending: Internal Medicine | Admitting: Internal Medicine

## 2015-01-10 DIAGNOSIS — E785 Hyperlipidemia, unspecified: Secondary | ICD-10-CM | POA: Diagnosis not present

## 2015-01-10 DIAGNOSIS — R928 Other abnormal and inconclusive findings on diagnostic imaging of breast: Secondary | ICD-10-CM | POA: Diagnosis not present

## 2015-01-10 DIAGNOSIS — E039 Hypothyroidism, unspecified: Secondary | ICD-10-CM | POA: Diagnosis not present

## 2015-01-10 DIAGNOSIS — R921 Mammographic calcification found on diagnostic imaging of breast: Secondary | ICD-10-CM | POA: Diagnosis not present

## 2015-01-10 DIAGNOSIS — R922 Inconclusive mammogram: Secondary | ICD-10-CM

## 2015-01-10 DIAGNOSIS — Z79899 Other long term (current) drug therapy: Secondary | ICD-10-CM | POA: Diagnosis not present

## 2015-01-10 DIAGNOSIS — M1990 Unspecified osteoarthritis, unspecified site: Secondary | ICD-10-CM | POA: Diagnosis not present

## 2015-01-16 DIAGNOSIS — N183 Chronic kidney disease, stage 3 (moderate): Secondary | ICD-10-CM | POA: Diagnosis not present

## 2015-01-16 DIAGNOSIS — I700 Atherosclerosis of aorta: Secondary | ICD-10-CM | POA: Diagnosis not present

## 2015-01-16 DIAGNOSIS — Z6834 Body mass index (BMI) 34.0-34.9, adult: Secondary | ICD-10-CM | POA: Diagnosis not present

## 2015-01-16 DIAGNOSIS — E039 Hypothyroidism, unspecified: Secondary | ICD-10-CM | POA: Diagnosis not present

## 2015-01-16 DIAGNOSIS — Z23 Encounter for immunization: Secondary | ICD-10-CM | POA: Diagnosis not present

## 2015-02-21 ENCOUNTER — Telehealth: Payer: Self-pay | Admitting: Internal Medicine

## 2015-02-21 NOTE — Telephone Encounter (Signed)
RECALL FOR [facility]CS °

## 2015-02-21 NOTE — Telephone Encounter (Signed)
Letter in the mail 

## 2015-03-28 ENCOUNTER — Encounter (HOSPITAL_COMMUNITY): Payer: Self-pay | Admitting: *Deleted

## 2015-03-28 ENCOUNTER — Inpatient Hospital Stay (HOSPITAL_COMMUNITY)
Admission: EM | Admit: 2015-03-28 | Discharge: 2015-03-31 | DRG: 355 | Disposition: A | Discharge status: Home / Self Care | Payer: Medicare Other | Attending: General Surgery | Admitting: General Surgery

## 2015-03-28 ENCOUNTER — Emergency Department (HOSPITAL_COMMUNITY): Payer: Medicare Other

## 2015-03-28 ENCOUNTER — Inpatient Hospital Stay (HOSPITAL_COMMUNITY): Payer: Medicare Other

## 2015-03-28 DIAGNOSIS — E039 Hypothyroidism, unspecified: Secondary | ICD-10-CM | POA: Diagnosis present

## 2015-03-28 DIAGNOSIS — Z23 Encounter for immunization: Secondary | ICD-10-CM | POA: Diagnosis not present

## 2015-03-28 DIAGNOSIS — E7800 Pure hypercholesterolemia, unspecified: Secondary | ICD-10-CM | POA: Diagnosis present

## 2015-03-28 DIAGNOSIS — R109 Unspecified abdominal pain: Secondary | ICD-10-CM

## 2015-03-28 DIAGNOSIS — F419 Anxiety disorder, unspecified: Secondary | ICD-10-CM | POA: Diagnosis present

## 2015-03-28 DIAGNOSIS — K439 Ventral hernia without obstruction or gangrene: Secondary | ICD-10-CM | POA: Diagnosis not present

## 2015-03-28 DIAGNOSIS — K436 Other and unspecified ventral hernia with obstruction, without gangrene: Secondary | ICD-10-CM | POA: Diagnosis present

## 2015-03-28 DIAGNOSIS — Z01818 Encounter for other preprocedural examination: Secondary | ICD-10-CM | POA: Diagnosis not present

## 2015-03-28 DIAGNOSIS — K5669 Other intestinal obstruction: Secondary | ICD-10-CM | POA: Diagnosis not present

## 2015-03-28 DIAGNOSIS — I10 Essential (primary) hypertension: Secondary | ICD-10-CM | POA: Diagnosis present

## 2015-03-28 DIAGNOSIS — R1031 Right lower quadrant pain: Secondary | ICD-10-CM | POA: Diagnosis not present

## 2015-03-28 DIAGNOSIS — K432 Incisional hernia without obstruction or gangrene: Secondary | ICD-10-CM | POA: Diagnosis not present

## 2015-03-28 LAB — URINE MICROSCOPIC-ADD ON

## 2015-03-28 LAB — URINALYSIS, ROUTINE W REFLEX MICROSCOPIC
Glucose, UA: NEGATIVE mg/dL
NITRITE: NEGATIVE
PH: 5.5 (ref 5.0–8.0)
Protein, ur: 30 mg/dL — AB
Specific Gravity, Urine: >1.03 — ABNORMAL HIGH (ref 1.005–1.030)

## 2015-03-28 LAB — CBC WITH DIFFERENTIAL/PLATELET
BASOS PCT: 0 %
Basophils Absolute: 0 10*3/uL (ref 0.0–0.1)
EOS ABS: 0 10*3/uL (ref 0.0–0.7)
EOS PCT: 0 %
HCT: 43.3 % (ref 36.0–46.0)
Hemoglobin: 15 g/dL (ref 12.0–15.0)
LYMPHS ABS: 1.6 10*3/uL (ref 0.7–4.0)
Lymphocytes Relative: 11 %
MCH: 29 pg (ref 26.0–34.0)
MCHC: 34.6 g/dL (ref 30.0–36.0)
MCV: 83.6 fL (ref 78.0–100.0)
MONO ABS: 1.1 10*3/uL — AB (ref 0.1–1.0)
MONOS PCT: 7 %
Neutro Abs: 11.6 10*3/uL — ABNORMAL HIGH (ref 1.7–7.7)
Neutrophils Relative %: 82 %
PLATELETS: 377 10*3/uL (ref 150–400)
RBC: 5.18 MIL/uL — ABNORMAL HIGH (ref 3.87–5.11)
RDW: 13.3 % (ref 11.5–15.5)
WBC: 14.3 10*3/uL — ABNORMAL HIGH (ref 4.0–10.5)

## 2015-03-28 LAB — COMPREHENSIVE METABOLIC PANEL
ALK PHOS: 85 U/L (ref 38–126)
ALT: 21 U/L (ref 14–54)
ANION GAP: 13 (ref 5–15)
AST: 31 U/L (ref 15–41)
Albumin: 4.5 g/dL (ref 3.5–5.0)
BUN: 32 mg/dL — ABNORMAL HIGH (ref 6–20)
CALCIUM: 10.1 mg/dL (ref 8.9–10.3)
CHLORIDE: 91 mmol/L — AB (ref 101–111)
CO2: 28 mmol/L (ref 22–32)
Creatinine, Ser: 1.7 mg/dL — ABNORMAL HIGH (ref 0.44–1.00)
GFR calc non Af Amer: 28 mL/min — ABNORMAL LOW (ref >60)
GFR, EST AFRICAN AMERICAN: 32 mL/min — AB (ref >60)
Glucose, Bld: 122 mg/dL — ABNORMAL HIGH (ref 65–99)
POTASSIUM: 3.8 mmol/L (ref 3.5–5.1)
SODIUM: 132 mmol/L — AB (ref 135–145)
Total Bilirubin: 1.2 mg/dL (ref 0.3–1.2)
Total Protein: 8.2 g/dL — ABNORMAL HIGH (ref 6.5–8.1)

## 2015-03-28 MED ORDER — ONDANSETRON HCL 4 MG/2ML IJ SOLN
4.0000 mg | Freq: Once | INTRAMUSCULAR | Status: AC
  Administered 2015-03-28: 4 mg via INTRAVENOUS
  Filled 2015-03-28: qty 2

## 2015-03-28 MED ORDER — KCL IN DEXTROSE-NACL 20-5-0.45 MEQ/L-%-% IV SOLN
INTRAVENOUS | Status: DC
  Administered 2015-03-28: 17:00:00 via INTRAVENOUS
  Administered 2015-03-29: 100 mL/h via INTRAVENOUS

## 2015-03-28 MED ORDER — CEFAZOLIN SODIUM-DEXTROSE 2-3 GM-% IV SOLR
2.0000 g | INTRAVENOUS | Status: AC
  Administered 2015-03-29: 2 g via INTRAVENOUS
  Filled 2015-03-28: qty 50

## 2015-03-28 MED ORDER — HYDROCHLOROTHIAZIDE 25 MG PO TABS
25.0000 mg | ORAL_TABLET | Freq: Every day | ORAL | Status: DC
  Administered 2015-03-28 – 2015-03-31 (×3): 25 mg via ORAL
  Filled 2015-03-28 (×3): qty 1

## 2015-03-28 MED ORDER — SODIUM CHLORIDE 0.9 % IV BOLUS (SEPSIS)
1000.0000 mL | Freq: Once | INTRAVENOUS | Status: AC
  Administered 2015-03-28: 1000 mL via INTRAVENOUS

## 2015-03-28 MED ORDER — DIPHENHYDRAMINE HCL 50 MG/ML IJ SOLN
12.5000 mg | Freq: Four times a day (QID) | INTRAMUSCULAR | Status: DC | PRN
  Filled 2015-03-28: qty 1

## 2015-03-28 MED ORDER — PNEUMOCOCCAL VAC POLYVALENT 25 MCG/0.5ML IJ INJ
0.5000 mL | INJECTION | INTRAMUSCULAR | Status: AC
  Administered 2015-03-30: 0.5 mL via INTRAMUSCULAR
  Filled 2015-03-28: qty 0.5

## 2015-03-28 MED ORDER — LOSARTAN POTASSIUM-HCTZ 100-25 MG PO TABS: 1.0000 | ORAL_TABLET | Freq: Every day | ORAL | Status: DC

## 2015-03-28 MED ORDER — DIPHENHYDRAMINE HCL 12.5 MG/5ML PO ELIX: 12.5000 mg | ORAL_SOLUTION | Freq: Four times a day (QID) | ORAL | Status: DC | PRN

## 2015-03-28 MED ORDER — DIATRIZOATE MEGLUMINE & SODIUM 66-10 % PO SOLN
ORAL | Status: AC
  Filled 2015-03-28: qty 30

## 2015-03-28 MED ORDER — OXYCODONE-ACETAMINOPHEN 5-325 MG PO TABS: 1.0000 | ORAL_TABLET | ORAL | Status: DC | PRN

## 2015-03-28 MED ORDER — ONDANSETRON HCL 4 MG/2ML IJ SOLN
4.0000 mg | Freq: Four times a day (QID) | INTRAMUSCULAR | Status: DC | PRN
  Administered 2015-03-28: 4 mg via INTRAVENOUS
  Filled 2015-03-28: qty 2

## 2015-03-28 MED ORDER — PANTOPRAZOLE SODIUM 40 MG IV SOLR
40.0000 mg | Freq: Every day | INTRAVENOUS | Status: DC
  Administered 2015-03-28 – 2015-03-29 (×2): 40 mg via INTRAVENOUS
  Filled 2015-03-28 (×2): qty 40

## 2015-03-28 MED ORDER — ONDANSETRON 4 MG PO TBDP: 4.0000 mg | ORAL_TABLET | ORAL | Status: DC | PRN

## 2015-03-28 MED ORDER — CITALOPRAM HYDROBROMIDE 20 MG PO TABS
20.0000 mg | ORAL_TABLET | Freq: Every day | ORAL | Status: DC
  Administered 2015-03-28 – 2015-03-30 (×3): 20 mg via ORAL
  Filled 2015-03-28 (×3): qty 1

## 2015-03-28 MED ORDER — ALUM & MAG HYDROXIDE-SIMETH 200-200-20 MG/5ML PO SUSP
30.0000 mL | Freq: Four times a day (QID) | ORAL | Status: DC | PRN
  Administered 2015-03-28 – 2015-03-31 (×4): 30 mL via ORAL
  Filled 2015-03-28 (×4): qty 30

## 2015-03-28 MED ORDER — HYDROMORPHONE HCL 1 MG/ML IJ SOLN
1.0000 mg | INTRAMUSCULAR | Status: DC | PRN
  Administered 2015-03-29 – 2015-03-30 (×3): 1 mg via INTRAVENOUS
  Filled 2015-03-28 (×6): qty 1

## 2015-03-28 MED ORDER — LOSARTAN POTASSIUM 50 MG PO TABS
100.0000 mg | ORAL_TABLET | Freq: Every day | ORAL | Status: DC
  Administered 2015-03-28 – 2015-03-31 (×3): 100 mg via ORAL
  Filled 2015-03-28 (×3): qty 2

## 2015-03-28 MED ORDER — ONDANSETRON 4 MG PO TBDP: 4.0000 mg | ORAL_TABLET | Freq: Four times a day (QID) | ORAL | Status: DC | PRN

## 2015-03-28 MED ORDER — ONDANSETRON HCL 4 MG/2ML IJ SOLN
4.0000 mg | INTRAMUSCULAR | Status: DC | PRN
  Administered 2015-03-28 – 2015-03-30 (×3): 4 mg via INTRAVENOUS
  Filled 2015-03-28 (×4): qty 2

## 2015-03-28 MED ORDER — LEVOTHYROXINE SODIUM 88 MCG PO TABS
88.0000 ug | ORAL_TABLET | Freq: Every day | ORAL | Status: DC
  Administered 2015-03-30 – 2015-03-31 (×2): 88 ug via ORAL
  Filled 2015-03-28 (×2): qty 1

## 2015-03-28 MED ORDER — ACETAMINOPHEN 650 MG RE SUPP: 650.0000 mg | Freq: Four times a day (QID) | RECTAL | Status: DC | PRN

## 2015-03-28 MED ORDER — ACETAMINOPHEN 325 MG PO TABS: 650.0000 mg | ORAL_TABLET | Freq: Four times a day (QID) | ORAL | Status: DC | PRN

## 2015-03-28 MED ORDER — ENOXAPARIN SODIUM 30 MG/0.3ML ~~LOC~~ SOLN
30.0000 mg | SUBCUTANEOUS | Status: DC
  Administered 2015-03-28 – 2015-03-29 (×2): 30 mg via SUBCUTANEOUS
  Filled 2015-03-28 (×2): qty 0.3

## 2015-03-28 MED ORDER — CHLORHEXIDINE GLUCONATE 4 % EX LIQD
1.0000 "application " | Freq: Once | CUTANEOUS | Status: AC
  Administered 2015-03-28: 1 via TOPICAL
  Filled 2015-03-28: qty 15

## 2015-03-28 NOTE — ED Notes (Addendum)
Pt reports n/v/d since yesterday. Reports pain in RLQ where she " has a hernia" , states it is sore due to vomiting. Also reports nausea for the past week.

## 2015-03-28 NOTE — ED Notes (Signed)
Gave pt ginger ale.  

## 2015-03-28 NOTE — H&P (Signed)
Kelli Gregory is an 80 y.o. female.   Chief Complaint: Right lower quadrant abdominal pain, nausea, vomiting HPI: Patient is a 80 year old white female who states she hasn't felt well over the past week with intermittent episodes of lower abdominal pain. She presented emergency room today due to worsening nausea and vomiting. CT scan of the abdomen reveals a right lower quadrant ventral hernia. There is bowel within this. Surgery was consulted. I was able to reduce the hernia in the right lower quadrant. Her nausea is resolved but is still tender in this region.  Past Medical History  Diagnosis Date  . Hypertension   . Hypercholesteremia   . Back pain, chronic   . PONV (postoperative nausea and vomiting)   . Hiatal hernia   . Arthritis   . Depression   . Arm fracture, left   . Hypothyroidism     Past Surgical History  Procedure Laterality Date  . Right oophorectomy  1960  . Cholecystectomy      2002-[facility]  . Cataract extraction, bilateral  5 yrs ago and 4 yrs    shapiro did both 1 was here and 1 in [place]  . Orif wrist fracture  10/26/2010    Procedure: OPEN REDUCTION INTERNAL FIXATION (ORIF) WRIST FRACTURE;  Surgeon: Arther Abbott, MD;  Location: [facility];  Service: Orthopedics;  Laterality: Left;  . Wrist fracture surgery  11/2010    dvr plate, left wrist    Family History  Problem Relation Age of Onset  . Anesthesia problems Neg Hx   . Hypotension Neg Hx   . Malignant hyperthermia Neg Hx   . Pseudochol deficiency Neg Hx    Social History:  reports that she has never smoked. She does not have any smokeless tobacco history on file. She reports that she does not drink alcohol or use illicit drugs.  Allergies:  Allergies  Allergen Reactions  . Ciprofloxacin Other (See Comments)    Not sure   . Sulfa Antibiotics Nausea And Vomiting  . Tramadol Nausea And Vomiting  . Poison Sumac Extract Itching, Swelling and Rash     (Not in a hospital admission)  Results for  orders placed or performed during the hospital encounter of 03/28/15 (from the past 48 hour(s))  CBC with Differential/Platelet     Status: Abnormal   Collection Time: 03/28/15  9:11 AM  Result Value Ref Range   WBC 14.3 (H) 4.0 - 10.5 K/uL   RBC 5.18 (H) 3.87 - 5.11 MIL/uL   Hemoglobin 15.0 12.0 - 15.0 g/dL   HCT 43.3 36.0 - 46.0 %   MCV 83.6 78.0 - 100.0 fL   MCH 29.0 26.0 - 34.0 pg   MCHC 34.6 30.0 - 36.0 g/dL   RDW 13.3 11.5 - 15.5 %   Platelets 377 150 - 400 K/uL   Neutrophils Relative % 82 %   Neutro Abs 11.6 (H) 1.7 - 7.7 K/uL   Lymphocytes Relative 11 %   Lymphs Abs 1.6 0.7 - 4.0 K/uL   Monocytes Relative 7 %   Monocytes Absolute 1.1 (H) 0.1 - 1.0 K/uL   Eosinophils Relative 0 %   Eosinophils Absolute 0.0 0.0 - 0.7 K/uL   Basophils Relative 0 %   Basophils Absolute 0.0 0.0 - 0.1 K/uL  Comprehensive metabolic panel     Status: Abnormal   Collection Time: 03/28/15  9:11 AM  Result Value Ref Range   Sodium 132 (L) 135 - 145 mmol/L   Potassium 3.8 3.5 - 5.1  mmol/L   Chloride 91 (L) 101 - 111 mmol/L   CO2 28 22 - 32 mmol/L   Glucose, Bld 122 (H) 65 - 99 mg/dL   BUN 32 (H) 6 - 20 mg/dL   Creatinine, Ser 1.70 (H) 0.44 - 1.00 mg/dL   Calcium 10.1 8.9 - 10.3 mg/dL   Total Protein 8.2 (H) 6.5 - 8.1 g/dL   Albumin 4.5 3.5 - 5.0 g/dL   AST 31 15 - 41 U/L   ALT 21 14 - 54 U/L   Alkaline Phosphatase 85 38 - 126 U/L   Total Bilirubin 1.2 0.3 - 1.2 mg/dL   GFR calc non Af Amer 28 (L) >60 mL/min   GFR calc Af Amer 32 (L) >60 mL/min    Comment: (NOTE) The eGFR has been calculated using the CKD EPI equation. This calculation has not been validated in all clinical situations. eGFR's persistently <60 mL/min signify possible Chronic Kidney Disease.    Anion gap 13 5 - 15  Urinalysis, Routine w reflex microscopic (not at [facility])     Status: Abnormal   Collection Time: 03/28/15 10:10 AM  Result Value Ref Range   Color, Urine YELLOW YELLOW   APPearance CLOUDY (A) CLEAR   Specific  Gravity, Urine >1.030 (H) 1.005 - 1.030   pH 5.5 5.0 - 8.0   Glucose, UA NEGATIVE NEGATIVE mg/dL   Hgb urine dipstick TRACE (A) NEGATIVE   Bilirubin Urine SMALL (A) NEGATIVE   Ketones, ur TRACE (A) NEGATIVE mg/dL   Protein, ur 30 (A) NEGATIVE mg/dL   Nitrite NEGATIVE NEGATIVE   Leukocytes, UA MODERATE (A) NEGATIVE  Urine microscopic-add on     Status: Abnormal   Collection Time: 03/28/15 10:10 AM  Result Value Ref Range   Squamous Epithelial / LPF TOO NUMEROUS TO COUNT (A) NONE SEEN   WBC, UA TOO NUMEROUS TO COUNT 0 - 5 WBC/hpf   RBC / HPF 6-30 0 - 5 RBC/hpf   Bacteria, UA MANY (A) NONE SEEN   Ct Abdomen Pelvis Wo Contrast  03/28/2015  CLINICAL DATA:  Nausea, vomiting and diarrhea since yesterday. Right lower quadrant abdominal pain. The patient reports a hernia at that location. Nausea for the past week. EXAM: CT ABDOMEN AND PELVIS WITHOUT CONTRAST TECHNIQUE: Multidetector CT imaging of the abdomen and pelvis was performed following the standard protocol without IV contrast. COMPARISON:  None. FINDINGS: Lower chest:  Mild bilateral dependent atelectasis/scarring. Hepatobiliary: Multiple liver cysts, including the previously described large right lobe cysts. Cholecystectomy clips. Pancreas: No mass or inflammatory process identified on this un-enhanced exam. Spleen: Within normal limits in size. Adrenals/Urinary Tract: No evidence of urolithiasis or hydronephrosis. No definite mass visualized on this un-enhanced exam. Stomach/Bowel: Multiple colonic diverticula. No evidence of diverticulitis. Small to moderate-sized sliding hiatal hernia. Multiple dilated small bowel loops to the level of a right lower anterior abdominal wall herniate dating between the anterior peritoneal later is without extension into the subcutaneous fat. The terminal ileum distal to the hernia is normal in caliber. Vascular/Lymphatic: Atheromatous arterial calcifications. No enlarged lymph nodes. Reproductive: Small uterus.   No adnexal masses. Other: None. Musculoskeletal: Lumbar and lower thoracic spine degenerative changes pre these include facet degenerative changes with grade 1 anterolisthesis at the L4-5 level and grade 1 retrolisthesis at the L5-S1 level. No fractures or pars defects are seen. IMPRESSION: 1. Small bowel obstruction due to entrapment of the distal ileum within a right lower anterior abdominal wall hernia extending between the peritoneal layers. This is at  the level of the lower pelvis. 2. No evidence of destroying relation. 3. Colonic diverticulosis. 4. Small to moderate-sized hiatal hernia. Electronically Signed   By: Claudie Revering M.D.   On: 03/28/2015 12:11    Review of Systems  Constitutional: Positive for malaise/fatigue.  HENT: Negative.   Eyes: Negative.   Respiratory: Negative.   Cardiovascular: Negative.   Gastrointestinal: Positive for nausea, vomiting and abdominal pain.  Genitourinary: Negative.   Musculoskeletal: Negative.   Skin: Negative.     Blood pressure 139/64, pulse 95, temperature 97.9 F (36.6 C), temperature source Oral, resp. rate 16, height _0  (1.651 m), weight 89.812 kg (198 lb), SpO2 96 %. Physical Exam  Vitals reviewed. Constitutional: She is oriented to person, place, and time. She appears well-developed and well-nourished.  HENT:  Head: Normocephalic and atraumatic.  Neck: Normal range of motion. Neck supple.  Cardiovascular: Normal rate, regular rhythm and normal heart sounds.   Respiratory: Effort normal and breath sounds normal.  GI: Soft. There is tenderness. There is no rebound.  Reducible right groin hernia, question Spigelian hernia.  Neurological: She is alert and oriented to person, place, and time.  Skin: Skin is warm and dry.     Assessment/Plan Impression: Ventral hernia with incarceration, currently reduced, bowel obstruction Plan: We'll admit the patient to the hospital for rollover pain and nausea. We'll then proceed with ventral  herniorrhaphy with mesh on 03/29/2015. The risks and benefits of the procedure including bleeding, infection, mesh use, and the possibility of recurrence of the hernia were fully explained to the patient, who gave informed consent.  Jaymar Loeber A 03/28/2015, 1:50 PM

## 2015-03-28 NOTE — ED Notes (Signed)
MD Zammit at bedside updating patient and family.  

## 2015-03-28 NOTE — ED Notes (Signed)
CT called and stated they will take pt for scan at 1115. Pt and family informed.

## 2015-03-28 NOTE — ED Notes (Signed)
MD Zammit at bedside. 

## 2015-03-28 NOTE — ED Notes (Signed)
Pt made aware a urine specimen was needed. Pt verbalized understanding and will notify staff when one can be obtained.   

## 2015-03-28 NOTE — ED Provider Notes (Signed)
CSN: [id number]     Arrival date & time 03/28/15  4098 History  By signing my name below, I, Kelli Gregory, attest that this documentation has been prepared under the direction and in the presence of Kelli Berkshire, MD. Electronically Signed: Evon Slack, ED Scribe. 03/28/2015. 9:00 AM.      Chief Complaint  Patient presents with  . Emesis   Patient is a 80 y.o. female presenting with vomiting. The history is provided by the patient (pt complains of 2 days of vomiting). No language interpreter was used.  Emesis Severity:  Moderate Timing:  Constant Quality:  Bilious material Able to tolerate:  Liquids Associated symptoms: abdominal pain and diarrhea   Associated symptoms: no headaches    HPI Comments: Kelli Gregory is a 80 y.o. female who presents to the Emergency Department complaining of nausea and vomiting onset 2 days prior. Reports associated RLQ abdominal pain and diarrhea. Pt states that she has tried taking a suppository with no relief. Pt also reports hx of abdominal hernia. Reports HX of Cholecystomy, appendectomy and cyst removal. Pt denies any other symptoms.    Past Medical History  Diagnosis Date  . Hypertension   . Hypercholesteremia   . Back pain, chronic   . PONV (postoperative nausea and vomiting)   . Hiatal hernia   . Arthritis   . Depression   . Arm fracture, left   . Hypothyroidism    Past Surgical History  Procedure Laterality Date  . Right oophorectomy  1960  . Cholecystectomy      2002-[facility]  . Cataract extraction, bilateral  5 yrs ago and 4 yrs    shapiro did both 1 was here and 1 in [place]  . Orif wrist fracture  10/26/2010    Procedure: OPEN REDUCTION INTERNAL FIXATION (ORIF) WRIST FRACTURE;  Surgeon: Fuller Canada, MD;  Location: [facility];  Service: Orthopedics;  Laterality: Left;  . Wrist fracture surgery  11/2010    dvr plate, left wrist   Family History  Problem Relation Age of Onset  . Anesthesia problems Neg Hx   . Hypotension  Neg Hx   . Malignant hyperthermia Neg Hx   . Pseudochol deficiency Neg Hx    Social History  Substance Use Topics  . Smoking status: Never Smoker   . Smokeless tobacco: None  . Alcohol Use: No   OB History    No data available      Review of Systems  Constitutional: Negative for fever, appetite change and fatigue.  HENT: Negative for congestion, ear discharge and sinus pressure.   Eyes: Negative for discharge.  Respiratory: Negative for cough.   Cardiovascular: Negative for chest pain.  Gastrointestinal: Positive for nausea, vomiting, abdominal pain and diarrhea.  Genitourinary: Negative for frequency and hematuria.  Musculoskeletal: Negative for back pain.  Skin: Negative for rash.  Neurological: Negative for seizures and headaches.  Psychiatric/Behavioral: Negative for hallucinations.     Allergies  Ciprofloxacin; Sulfa antibiotics; Tramadol; and Poison sumac extract  Home Medications   Prior to Admission medications   Medication Sig Start Date End Date Taking? Authorizing Provider  buPROPion (WELLBUTRIN SR) 150 MG 12 hr tablet Take 150 mg by mouth 2 (two) times daily.    Historical Provider, MD  citalopram (CELEXA) 20 MG tablet Take 20 mg by mouth every morning.      Historical Provider, MD  diclofenac (VOLTAREN) 50 MG EC tablet Take 1 tablet (50 mg total) by mouth 2 (two) times daily. 02/05/12   Duffy Rhody  Elita Quick, MD  gabapentin (NEURONTIN) 600 MG tablet Take 600 mg by mouth at bedtime.      Historical Provider, MD  ibuprofen (ADVIL,MOTRIN) 800 MG tablet  06/10/11   Historical Provider, MD  levothyroxine (SYNTHROID, LEVOTHROID) 112 MCG tablet Take 112 mcg by mouth daily.      Historical Provider, MD  losartan (COZAAR) 50 MG tablet Take 50 mg by mouth every morning.      Historical Provider, MD  sodium chloride (OCEAN) 0.65 % nasal spray Place 2 sprays into the nose at bedtime as needed.      Historical Provider, MD  sulfamethoxazole-trimethoprim (BACTRIM DS,SEPTRA DS)  800-160 MG per tablet Take 1 tablet by mouth 2 (two) times daily. 11/29/10   Vickki Hearing, MD   BP 145/70 mmHg  Pulse 96  Temp(Src) 97.9 F (36.6 C) (Oral)  Resp 16  Ht  (1.651 m)  Wt 198 lb (89.812 kg)  BMI 32.95 kg/m2  SpO2 97%   Physical Exam  Constitutional: She is oriented to person, place, and time. She appears well-developed.  HENT:  Head: Normocephalic.  Eyes: Conjunctivae and EOM are normal. No scleral icterus.  Neck: Neck supple. No thyromegaly present.  Cardiovascular: Normal rate and regular rhythm.  Exam reveals no gallop and no friction rub.   No murmur heard. Pulmonary/Chest: No stridor. She has no wheezes. She has no rales. She exhibits no tenderness.  Abdominal: She exhibits no distension. There is tenderness (moderate) in the right lower quadrant. There is no rebound.  Musculoskeletal: Normal range of motion. She exhibits no edema.  Lymphadenopathy:    She has no cervical adenopathy.  Neurological: She is oriented to person, place, and time. She exhibits normal muscle tone. Coordination normal.  Skin: No rash noted. No erythema.  Psychiatric: She has a normal mood and affect. Her behavior is normal.  Nursing note and vitals reviewed.   ED Course  Procedures (including critical care time) DIAGNOSTIC STUDIES: Oxygen Saturation is 97% on RA, normal by my interpretation.    COORDINATION OF CARE: 9:00 AM-Discussed treatment plan with pt at bedside and pt agreed to plan.    Labs Review Labs Reviewed - No data to display  Imaging Review No results found.    EKG Interpretation None      MDM   Final diagnoses:  None     Patient with a small bowel obstruction secondary to hernia. General surgery will admit patient and take her to surgery to repair it tomorrow   The chart was scribed for me under my direct supervision.  I personally performed the history, physical, and medical decision making and all procedures in the evaluation of this  patient.Kelli Berkshire, MD 03/28/15 [id number]

## 2015-03-29 ENCOUNTER — Inpatient Hospital Stay (HOSPITAL_COMMUNITY): Payer: Medicare Other

## 2015-03-29 ENCOUNTER — Encounter (HOSPITAL_COMMUNITY)
Admission: EM | Disposition: A | Discharge status: Home / Self Care | Payer: Self-pay | Source: Home / Self Care | Attending: General Surgery

## 2015-03-29 ENCOUNTER — Encounter (HOSPITAL_COMMUNITY): Payer: Self-pay | Admitting: Anesthesiology

## 2015-03-29 ENCOUNTER — Inpatient Hospital Stay (HOSPITAL_COMMUNITY): Payer: Medicare Other | Admitting: Anesthesiology

## 2015-03-29 HISTORY — PX: VENTRAL HERNIA REPAIR: SHX424

## 2015-03-29 LAB — URINE CULTURE: Culture: 5000

## 2015-03-29 LAB — SURGICAL PCR SCREEN
MRSA, PCR: NEGATIVE
STAPHYLOCOCCUS AUREUS: POSITIVE — AB

## 2015-03-29 SURGERY — REPAIR, HERNIA, VENTRAL
Anesthesia: General | Site: Abdomen | Laterality: Right

## 2015-03-29 MED ORDER — SCOPOLAMINE 1 MG/3DAYS TD PT72
MEDICATED_PATCH | TRANSDERMAL | Status: AC
  Filled 2015-03-29: qty 1

## 2015-03-29 MED ORDER — DEXAMETHASONE SODIUM PHOSPHATE 10 MG/ML IJ SOLN
INTRAMUSCULAR | Status: DC | PRN
  Administered 2015-03-29: 8 mg via INTRAVENOUS

## 2015-03-29 MED ORDER — ONDANSETRON HCL 4 MG/2ML IJ SOLN
4.0000 mg | Freq: Once | INTRAMUSCULAR | Status: AC
  Administered 2015-03-29: 4 mg via INTRAVENOUS

## 2015-03-29 MED ORDER — DEXAMETHASONE SODIUM PHOSPHATE 4 MG/ML IJ SOLN
INTRAMUSCULAR | Status: AC
  Filled 2015-03-29: qty 1

## 2015-03-29 MED ORDER — BUPIVACAINE LIPOSOME 1.3 % IJ SUSP
INTRAMUSCULAR | Status: AC
  Filled 2015-03-29: qty 20

## 2015-03-29 MED ORDER — FENTANYL CITRATE (PF) 100 MCG/2ML IJ SOLN
INTRAMUSCULAR | Status: AC
  Filled 2015-03-29: qty 2

## 2015-03-29 MED ORDER — LACTATED RINGERS IV SOLN
INTRAVENOUS | Status: DC
  Administered 2015-03-29: 75 mL/h via INTRAVENOUS
  Administered 2015-03-29 – 2015-03-30 (×4): via INTRAVENOUS

## 2015-03-29 MED ORDER — EPHEDRINE SULFATE 50 MG/ML IJ SOLN
INTRAMUSCULAR | Status: DC | PRN
  Administered 2015-03-29: 10 mg via INTRAVENOUS

## 2015-03-29 MED ORDER — PHENYLEPHRINE HCL 10 MG/ML IJ SOLN
INTRAMUSCULAR | Status: DC | PRN
  Administered 2015-03-29: 80 ug via INTRAVENOUS

## 2015-03-29 MED ORDER — NEOSTIGMINE METHYLSULFATE 10 MG/10ML IV SOLN
INTRAVENOUS | Status: DC | PRN
  Administered 2015-03-29: 4 mg via INTRAVENOUS

## 2015-03-29 MED ORDER — ROCURONIUM BROMIDE 100 MG/10ML IV SOLN
INTRAVENOUS | Status: DC | PRN
  Administered 2015-03-29: 5 mg via INTRAVENOUS
  Administered 2015-03-29: 10 mg via INTRAVENOUS
  Administered 2015-03-29: 15 mg via INTRAVENOUS

## 2015-03-29 MED ORDER — GLYCOPYRROLATE 0.2 MG/ML IJ SOLN
INTRAMUSCULAR | Status: DC | PRN
  Administered 2015-03-29: 0.6 mg via INTRAVENOUS

## 2015-03-29 MED ORDER — POVIDONE-IODINE 10 % OINT PACKET
TOPICAL_OINTMENT | CUTANEOUS | Status: DC | PRN
  Administered 2015-03-29: 1 via TOPICAL

## 2015-03-29 MED ORDER — CEFAZOLIN SODIUM-DEXTROSE 2-3 GM-% IV SOLR
INTRAVENOUS | Status: AC
  Filled 2015-03-29: qty 50

## 2015-03-29 MED ORDER — ONDANSETRON HCL 4 MG/2ML IJ SOLN
INTRAMUSCULAR | Status: AC
  Filled 2015-03-29: qty 2

## 2015-03-29 MED ORDER — PROPOFOL 10 MG/ML IV BOLUS
INTRAVENOUS | Status: DC | PRN
  Administered 2015-03-29: 100 mg via INTRAVENOUS

## 2015-03-29 MED ORDER — FENTANYL CITRATE (PF) 100 MCG/2ML IJ SOLN
INTRAMUSCULAR | Status: DC | PRN
  Administered 2015-03-29 (×5): 50 ug via INTRAVENOUS

## 2015-03-29 MED ORDER — SUCCINYLCHOLINE CHLORIDE 20 MG/ML IJ SOLN
INTRAMUSCULAR | Status: DC | PRN
  Administered 2015-03-29: 120 mg via INTRAVENOUS

## 2015-03-29 MED ORDER — ALBUTEROL SULFATE HFA 108 (90 BASE) MCG/ACT IN AERS
INHALATION_SPRAY | RESPIRATORY_TRACT | Status: AC
  Filled 2015-03-29: qty 6.7

## 2015-03-29 MED ORDER — LORAZEPAM 2 MG/ML IJ SOLN: 0.5000 mg | INTRAMUSCULAR | Status: DC | PRN

## 2015-03-29 MED ORDER — DEXAMETHASONE SODIUM PHOSPHATE 4 MG/ML IJ SOLN
INTRAMUSCULAR | Status: AC
  Filled 2015-03-29: qty 2

## 2015-03-29 MED ORDER — MIDAZOLAM HCL 2 MG/2ML IJ SOLN
INTRAMUSCULAR | Status: AC
  Filled 2015-03-29: qty 2

## 2015-03-29 MED ORDER — 0.9 % SODIUM CHLORIDE (POUR BTL) OPTIME
TOPICAL | Status: DC | PRN
  Administered 2015-03-29: 800 mL

## 2015-03-29 MED ORDER — LIDOCAINE HCL (CARDIAC) 20 MG/ML IV SOLN
INTRAVENOUS | Status: DC | PRN
  Administered 2015-03-29: 25 mg via INTRAVENOUS

## 2015-03-29 MED ORDER — HYDROCODONE-ACETAMINOPHEN 5-325 MG PO TABS: 1.0000 | ORAL_TABLET | Freq: Four times a day (QID) | ORAL | Status: DC | PRN

## 2015-03-29 MED ORDER — POVIDONE-IODINE 10 % EX OINT
TOPICAL_OINTMENT | CUTANEOUS | Status: AC
  Filled 2015-03-29: qty 1

## 2015-03-29 MED ORDER — FENTANYL CITRATE (PF) 250 MCG/5ML IJ SOLN
INTRAMUSCULAR | Status: AC
  Filled 2015-03-29: qty 5

## 2015-03-29 MED ORDER — SCOPOLAMINE 1 MG/3DAYS TD PT72
1.0000 | MEDICATED_PATCH | Freq: Once | TRANSDERMAL | Status: DC
  Administered 2015-03-29: 1.5 mg via TRANSDERMAL

## 2015-03-29 MED ORDER — ONDANSETRON HCL 4 MG/2ML IJ SOLN
4.0000 mg | Freq: Once | INTRAMUSCULAR | Status: AC | PRN
  Administered 2015-03-29: 4 mg via INTRAVENOUS

## 2015-03-29 MED ORDER — BUPIVACAINE LIPOSOME 1.3 % IJ SUSP
INTRAMUSCULAR | Status: DC | PRN
  Administered 2015-03-29: 20 mL

## 2015-03-29 MED ORDER — FENTANYL CITRATE (PF) 100 MCG/2ML IJ SOLN
25.0000 ug | INTRAMUSCULAR | Status: DC | PRN
Start: 2015-03-29 — End: 2015-03-29
  Administered 2015-03-29: 50 ug via INTRAVENOUS

## 2015-03-29 MED ORDER — DEXAMETHASONE SODIUM PHOSPHATE 4 MG/ML IJ SOLN
4.0000 mg | Freq: Once | INTRAMUSCULAR | Status: AC
Start: 2015-03-29 — End: 2015-03-29
  Administered 2015-03-29: 4 mg via INTRAVENOUS

## 2015-03-29 MED ORDER — MIDAZOLAM HCL 2 MG/2ML IJ SOLN
1.0000 mg | INTRAMUSCULAR | Status: DC | PRN
  Administered 2015-03-29: 2 mg via INTRAVENOUS

## 2015-03-29 MED ORDER — SIMETHICONE 80 MG PO CHEW: 40.0000 mg | CHEWABLE_TABLET | Freq: Four times a day (QID) | ORAL | Status: DC | PRN

## 2015-03-29 SURGICAL SUPPLY — 39 items
BAG HAMPER (MISCELLANEOUS) ×3 IMPLANT
BANDAGE ELASTIC 4 VELCRO NS (GAUZE/BANDAGES/DRESSINGS) ×2 IMPLANT
CHLORAPREP W/TINT 26ML (MISCELLANEOUS) ×3 IMPLANT
CLOTH BEACON ORANGE TIMEOUT ST (SAFETY) ×3 IMPLANT
COVER LIGHT HANDLE STERIS (MISCELLANEOUS) ×6 IMPLANT
ELECT REM PT RETURN 9FT ADLT (ELECTROSURGICAL) ×3
ELECTRODE REM PT RTRN 9FT ADLT (ELECTROSURGICAL) ×1 IMPLANT
FORMALIN 10 PREFIL 480ML (MISCELLANEOUS) ×3 IMPLANT
GAUZE SPONGE 4X4 12PLY STRL (GAUZE/BANDAGES/DRESSINGS) ×3 IMPLANT
GLOVE BIOGEL PI IND STRL 7.0 (GLOVE) ×1 IMPLANT
GLOVE BIOGEL PI INDICATOR 7.0 (GLOVE) ×2
GLOVE SURG SS PI 7.5 STRL IVOR (GLOVE) ×3 IMPLANT
GOWN STRL REUS W/ TWL XL LVL3 (GOWN DISPOSABLE) IMPLANT
GOWN STRL REUS W/TWL LRG LVL3 (GOWN DISPOSABLE) ×8 IMPLANT
GOWN STRL REUS W/TWL XL LVL3 (GOWN DISPOSABLE)
INST SET MAJOR GENERAL (KITS) ×3 IMPLANT
KIT ROOM TURNOVER APOR (KITS) ×3 IMPLANT
LIGASURE IMPACT 36 18CM CVD LR (INSTRUMENTS) ×2 IMPLANT
MANIFOLD NEPTUNE II (INSTRUMENTS) ×3 IMPLANT
MESH VENTRALEX ST 2.5 CRC MED (Mesh General) ×3 IMPLANT
NS IRRIG 1000ML POUR BTL (IV SOLUTION) ×3 IMPLANT
PACK ABDOMINAL MAJOR (CUSTOM PROCEDURE TRAY) ×3 IMPLANT
PAD ARMBOARD 7.5X6 YLW CONV (MISCELLANEOUS) ×3 IMPLANT
SET BASIN LINEN APH (SET/KITS/TRAYS/PACK) ×3 IMPLANT
STAPLER VISISTAT (STAPLE) ×3 IMPLANT
SUT ETHIBOND 0 MO6 C/R (SUTURE) ×6 IMPLANT
SUT ETHIBOND NAB MO 7 #0 18IN (SUTURE) IMPLANT
SUT NOVA NAB GS-21 1 T12 (SUTURE) IMPLANT
SUT NOVA NAB GS-22 2 2-0 T-19 (SUTURE) ×5 IMPLANT
SUT NOVA NAB GS-26 0 60 (SUTURE) IMPLANT
SUT SILK 2 0 (SUTURE)
SUT SILK 2-0 18XBRD TIE 12 (SUTURE) IMPLANT
SUT VIC AB 2-0 CT2 27 (SUTURE) IMPLANT
SUT VIC AB 3-0 SH 27 (SUTURE)
SUT VIC AB 3-0 SH 27X BRD (SUTURE) IMPLANT
SUT VIC AB 4-0 PS2 27 (SUTURE) IMPLANT
SUT VICRYL AB 2 0 TIES (SUTURE) ×3 IMPLANT
SYR 20CC LL (SYRINGE) ×3 IMPLANT
TAPE CLOTH SURG 4X10 WHT LF (GAUZE/BANDAGES/DRESSINGS) ×2 IMPLANT

## 2015-03-29 NOTE — Op Note (Signed)
Patient:  Kelli Gregory  DOB:  1935-12-09  MRN:  [id number]   Preop Diagnosis:  Bowel obstruction, ventral hernia  Postop Diagnosis:  Same  Procedure:  Ventral herniorrhaphy with mesh  Surgeon:  Franky Macho, M.D.  Anes:  Gen. endotracheal  Indications:  Patient is a 80 year old white female who presents with a greater than one-week history of lower abdominal pain nausea, and indigestion. CT scan of the abdomen was performed which revealed a right lower quadrant hernia that only extended within the muscle layer of the abdominal wall. This appeared to be distal small bowel in nature. The was reducible at bedside. The patient now comes the operating room for ventral herniorrhaphy with mesh. The risks and benefits of the procedure including bleeding, infection, mesh use, and the possibility of finding infarcted bowel were fully explained to the patient, who gave informed consent.  Procedure note:  The patient was placed the supine position. After induction of general endotracheal anesthesia, a gastric tube was inserted and 800 mL of feculent smelling contents were aspirated. The abdomen was prepped and draped using usual sterile technique with ChloraPrep. Surgical site confirmation was performed.  A right oblique incision was made above the inguinal region. This was taken down to the external oblique aponeuroses. This was incised and an obvious bulge was present. A hernia sac containing properitoneal fat was also present. There was edematous and somewhat necrotic. The hernia sac was entered into and a small piece of terminal ileum was noted to be present. It had already been reduced, and the was evidence of a chronic scarring with a noncritical stricture. The bowel was inspected and there was no evidence of ischemia. Good peristalsis was noted to the cecum. It was elected to proceed with mesh placement and repair. A 6.4 cm Bard ventralax ST patch was then inserted and secured to the peritoneum using  0 Ethibond interrupted sutures. The perineum and internal oblique layer was reapproximated using 0 Ethibond interrupted sutures. The external oblique aponeuroses was reapproximated using 0 Ethibond interrupted sutures. The subcutaneous layer was reapproximated using 3-0 Vicryl interrupted sutures. Exparel was instilled into the surrounding wound. The skin was closed using staples. Betadine ointment and a dry sterile dressing were applied.  All tape and needle counts were correct at the end of the procedure. The patient was extubated in the operating room and transferred to PACU in stable condition.  Complications:   none  EBL:   minimal  Specimen:   hernia sac

## 2015-03-29 NOTE — Anesthesia Postprocedure Evaluation (Signed)
Anesthesia Post Note  Patient: Kelli Gregory  Procedure(s) Performed: Procedure(s) (LRB): HERNIA REPAIR VENTRAL ADULT (Right)  Patient location during evaluation: PACU Anesthesia Type: General Level of consciousness: awake and alert, oriented and patient cooperative Pain management: pain level controlled Vital Signs Assessment: post-procedure vital signs reviewed and stable Respiratory status: spontaneous breathing, respiratory function stable and patient connected to nasal cannula oxygen Cardiovascular status: stable Postop Assessment: no signs of nausea or vomiting Anesthetic complications: no Anesthetic complication details: anesthesia complications   Last Vitals:  Filed Vitals:   03/29/15 1320 03/29/15 1535  BP: 126/61 149/51  Pulse:  97  Temp:  37.1 C  Resp: 17 18    Last Pain:  Filed Vitals:   03/29/15 1552  PainSc: 8                  ADAMS, AMY A

## 2015-03-29 NOTE — Anesthesia Preprocedure Evaluation (Signed)
Anesthesia Evaluation  Patient identified by MRN, date of birth, ID band Patient awake    Reviewed: Allergy & Precautions, H&P , NPO status , Patient's Chart, lab work & pertinent test results  History of Anesthesia Complications (+) PONV and history of anesthetic complications  Airway Mallampati: I  TM Distance: >3 FB Neck ROM: Full    Dental  (+) Partial Upper, Dental Advisory Given   Pulmonary neg pulmonary ROS,    Pulmonary exam normal        Cardiovascular hypertension, Pt. on medications  Rhythm:Regular Rate:Normal     Neuro/Psych PSYCHIATRIC DISORDERS Depression  Neuromuscular disease    GI/Hepatic Neg liver ROS, hiatal hernia,   Endo/Other  Hypothyroidism   Renal/GU negative Renal ROS     Musculoskeletal  (+) Arthritis , Osteoarthritis,    Abdominal Normal abdominal exam  (+)   Peds  Hematology negative hematology ROS (+)   Anesthesia Other Findings   Reproductive/Obstetrics negative OB ROS                             Anesthesia Physical Anesthesia Plan  ASA: II  Anesthesia Plan: General   Post-op Pain Management:    Induction: Intravenous, Rapid sequence and Cricoid pressure planned  Airway Management Planned: Oral ETT  Additional Equipment:   Intra-op Plan:   Post-operative Plan: Extubation in OR  Informed Consent: I have reviewed the patients History and Physical, chart, labs and discussed the procedure including the risks, benefits and alternatives for the proposed anesthesia with the patient or authorized representative who has indicated his/her understanding and acceptance.     Plan Discussed with: CRNA  Anesthesia Plan Comments:         Anesthesia Quick Evaluation

## 2015-03-29 NOTE — Anesthesia Procedure Notes (Signed)
Procedure Name: Intubation Date/Time: 03/29/2015 2:26 PM Performed by: Pernell Dupre, AMY A Pre-anesthesia Checklist: Patient identified, Patient being monitored, Timeout performed, Emergency Drugs available and Suction available Patient Re-evaluated:Patient Re-evaluated prior to inductionOxygen Delivery Method: Circle System Utilized Preoxygenation: Pre-oxygenation with 100% oxygen Intubation Type: IV induction, Cricoid Pressure applied and Rapid sequence Ventilation: Mask ventilation without difficulty and Mask ventilation throughout procedure Laryngoscope Size: 3 and Miller Grade View: Grade I Tube type: Oral Tube size: 7.0 mm Number of attempts: 1 Airway Equipment and Method: Stylet Placement Confirmation: ETT inserted through vocal cords under direct vision,  positive ETCO2 and breath sounds checked- equal and bilateral Secured at: 21 cm Tube secured with: Tape Dental Injury: Teeth and Oropharynx as per pre-operative assessment

## 2015-03-29 NOTE — Transfer of Care (Signed)
Immediate Anesthesia Transfer of Care Note  Patient: Kelli Gregory  Procedure(s) Performed: Procedure(s): HERNIA REPAIR VENTRAL ADULT (Right)  Patient Location: PACU  Anesthesia Type:General  Level of Consciousness: awake, alert , oriented and patient cooperative  Airway & Oxygen Therapy: Patient Spontanous Breathing and Patient connected to face mask oxygen  Post-op Assessment: Report given to RN and Post -op Vital signs reviewed and stable  Post vital signs: Reviewed and stable  Last Vitals:  Filed Vitals:   03/29/15 1320 03/29/15 1535  BP: 126/61 149/51  Pulse:  97  Temp:  37.1 C  Resp: 17 18    Complications: No apparent anesthesia complications

## 2015-03-30 ENCOUNTER — Encounter (HOSPITAL_COMMUNITY): Payer: Self-pay | Admitting: General Surgery

## 2015-03-30 DIAGNOSIS — K436 Other and unspecified ventral hernia with obstruction, without gangrene: Secondary | ICD-10-CM | POA: Diagnosis not present

## 2015-03-30 LAB — CBC
HCT: 33.3 % — ABNORMAL LOW (ref 36.0–46.0)
Hemoglobin: 11.2 g/dL — ABNORMAL LOW (ref 12.0–15.0)
MCH: 28.4 pg (ref 26.0–34.0)
MCHC: 33.6 g/dL (ref 30.0–36.0)
MCV: 84.5 fL (ref 78.0–100.0)
PLATELETS: 285 10*3/uL (ref 150–400)
RBC: 3.94 MIL/uL (ref 3.87–5.11)
RDW: 13.6 % (ref 11.5–15.5)
WBC: 12.1 10*3/uL — ABNORMAL HIGH (ref 4.0–10.5)

## 2015-03-30 LAB — BASIC METABOLIC PANEL
Anion gap: 7 (ref 5–15)
BUN: 28 mg/dL — AB (ref 6–20)
CHLORIDE: 97 mmol/L — AB (ref 101–111)
CO2: 27 mmol/L (ref 22–32)
CREATININE: 1.25 mg/dL — AB (ref 0.44–1.00)
Calcium: 8.3 mg/dL — ABNORMAL LOW (ref 8.9–10.3)
GFR calc non Af Amer: 40 mL/min — ABNORMAL LOW (ref >60)
GFR, EST AFRICAN AMERICAN: 47 mL/min — AB (ref >60)
GLUCOSE: 112 mg/dL — AB (ref 65–99)
Potassium: 4.2 mmol/L (ref 3.5–5.1)
Sodium: 131 mmol/L — ABNORMAL LOW (ref 135–145)

## 2015-03-30 LAB — GLUCOSE, CAPILLARY: Glucose-Capillary: 83 mg/dL (ref 65–99)

## 2015-03-30 MED ORDER — PANTOPRAZOLE SODIUM 40 MG PO TBEC
40.0000 mg | DELAYED_RELEASE_TABLET | Freq: Every day | ORAL | Status: DC
  Administered 2015-03-30 – 2015-03-31 (×2): 40 mg via ORAL
  Filled 2015-03-30 (×2): qty 1

## 2015-03-30 MED ORDER — ALBUTEROL SULFATE (2.5 MG/3ML) 0.083% IN NEBU: 2.5000 mg | INHALATION_SOLUTION | RESPIRATORY_TRACT | Status: DC | PRN

## 2015-03-30 MED ORDER — ENOXAPARIN SODIUM 40 MG/0.4ML ~~LOC~~ SOLN: 40.0000 mg | SUBCUTANEOUS | Status: DC

## 2015-03-30 MED ORDER — ENOXAPARIN SODIUM 30 MG/0.3ML ~~LOC~~ SOLN
30.0000 mg | SUBCUTANEOUS | Status: DC
  Administered 2015-03-30: 30 mg via SUBCUTANEOUS
  Filled 2015-03-30: qty 0.3

## 2015-03-30 NOTE — Addendum Note (Signed)
Addendum  created 03/30/15 1610 by Earleen Newport, CRNA   Modules edited: Charges VN

## 2015-03-30 NOTE — Addendum Note (Signed)
Addendum  created 03/30/15 6045 by Earleen Newport, CRNA   Modules edited: Clinical Notes   Clinical Notes:  File: [id number]

## 2015-03-30 NOTE — Progress Notes (Signed)
1 Day Post-Op  Subjective: Patient states she has a cough, but denies any shortness of breath. She has moderate incisional pain. No nausea or vomiting noted.  Objective: Vital signs in last 24 hours: Temp:  [97.7 F (36.5 C)-98.7 F (37.1 C)] 98.6 F (37 C) (01/26 0612) Pulse Rate:  [80-97] 91 (01/26 0612) Resp:  [14-26] 20 (01/26 0612) BP: (112-152)/(49-73) 112/49 mmHg (01/26 0612) SpO2:  [92 %-99 %] 98 % (01/26 0612) Last BM Date: 03/27/15  Intake/Output from previous day: 01/25 0701 - 01/26 0700 In: 2450 [I.V.:2450] Out: 25 [Blood:25] Intake/Output this shift:    General appearance: alert, cooperative and no distress Resp: clear to auscultation bilaterally Cardio: regular rate and rhythm, S1, S2 normal, no murmur, click, rub or gallop GI: Soft. Dressing dry and intact. Nondistended.  Lab Results:   Recent Labs  03/28/15 0911 03/30/15 0559  WBC 14.3* 12.1*  HGB 15.0 11.2*  HCT 43.3 33.3*  PLT 377 285   BMET  Recent Labs  03/28/15 0911 03/30/15 0559  NA 132* 131*  K 3.8 4.2  CL 91* 97*  CO2 28 27  GLUCOSE 122* 112*  BUN 32* 28*  CREATININE 1.70* 1.25*  CALCIUM 10.1 8.3*   PT/INR No results for input(s): LABPROT, INR in the last 72 hours.  Studies/Results: Ct Abdomen Pelvis Wo Contrast  03/28/2015  CLINICAL DATA:  Nausea, vomiting and diarrhea since yesterday. Right lower quadrant abdominal pain. The patient reports a hernia at that location. Nausea for the past week. EXAM: CT ABDOMEN AND PELVIS WITHOUT CONTRAST TECHNIQUE: Multidetector CT imaging of the abdomen and pelvis was performed following the standard protocol without IV contrast. COMPARISON:  None. FINDINGS: Lower chest:  Mild bilateral dependent atelectasis/scarring. Hepatobiliary: Multiple liver cysts, including the previously described large right lobe cysts. Cholecystectomy clips. Pancreas: No mass or inflammatory process identified on this un-enhanced exam. Spleen: Within normal limits in size.  Adrenals/Urinary Tract: No evidence of urolithiasis or hydronephrosis. No definite mass visualized on this un-enhanced exam. Stomach/Bowel: Multiple colonic diverticula. No evidence of diverticulitis. Small to moderate-sized sliding hiatal hernia. Multiple dilated small bowel loops to the level of a right lower anterior abdominal wall herniate dating between the anterior peritoneal later is without extension into the subcutaneous fat. The terminal ileum distal to the hernia is normal in caliber. Vascular/Lymphatic: Atheromatous arterial calcifications. No enlarged lymph nodes. Reproductive: Small uterus.  No adnexal masses. Other: None. Musculoskeletal: Lumbar and lower thoracic spine degenerative changes pre these include facet degenerative changes with grade 1 anterolisthesis at the L4-5 level and grade 1 retrolisthesis at the L5-S1 level. No fractures or pars defects are seen. IMPRESSION: 1. Small bowel obstruction due to entrapment of the distal ileum within a right lower anterior abdominal wall hernia extending between the peritoneal layers. This is at the level of the lower pelvis. 2. No evidence of destroying relation. 3. Colonic diverticulosis. 4. Small to moderate-sized hiatal hernia. Electronically Signed   By: Beckie Salts M.D.   On: 03/28/2015 12:11   Dg Chest 2 View  03/29/2015  CLINICAL DATA:  Preop for hernia surgery EXAM: CHEST  2 VIEW COMPARISON:  09/16/2012 and 04/12/2009 FINDINGS: Cardiomediastinal silhouette is stable. No acute infiltrate or pleural effusion. No pulmonary edema. Probable chronic mild interstitial prominence. Degenerative changes mid and lower thoracic spine. IMPRESSION: No active cardiopulmonary disease. Degenerative changes thoracic spine. Electronically Signed   By: Natasha Mead M.D.   On: 03/29/2015 12:57    Anti-infectives: Anti-infectives    Start  Dose/Rate Route Frequency Ordered Stop   03/29/15 0600  ceFAZolin (ANCEF) IVPB 2 g/50 mL premix     2 g 100 mL/hr  over 30 Minutes Intravenous On call to O.R. 03/28/15 1632 03/29/15 1433      Assessment/Plan: s/p Procedure(s): HERNIA REPAIR VENTRAL ADULT Impression: Stable on postoperative day one. Patient does have a cough, but no evidence of wheezing. There was a question at the time of induction of aspiration of gastric contents. Will continue to monitor patient with oxygen saturations. We'll advance to regular diet. Anticipate discharge in next 24-48 hours.  LOS: 2 days    Kimie Pidcock A 03/30/2015

## 2015-03-30 NOTE — Addendum Note (Signed)
Addendum  created 03/30/15 0744 by Earleen Newport, CRNA   Modules edited: Charges VN

## 2015-03-30 NOTE — Anesthesia Postprocedure Evaluation (Signed)
Anesthesia Post Note  Patient: Kelli Gregory  Procedure(s) Performed: Procedure(s) (LRB): HERNIA REPAIR VENTRAL ADULT (Right)  Patient location during evaluation: Nursing Unit Anesthesia Type: General Level of consciousness: awake and alert and oriented Pain management: pain level controlled Vital Signs Assessment: post-procedure vital signs reviewed and stable Respiratory status: respiratory function stable Cardiovascular status: stable Postop Assessment: no signs of nausea or vomiting Anesthetic complications: no    Last Vitals:  Filed Vitals:   03/30/15 0224 03/30/15 0612  BP: 118/73 112/49  Pulse: 96 91  Temp: 36.9 C 37 C  Resp: 20 20    Last Pain:  Filed Vitals:   03/30/15 0834  PainSc: 7                  ADAMS, AMY A

## 2015-03-30 NOTE — Care Management Note (Signed)
Case Management Note  Patient Details  Name: THRESA DOZIER MRN: [id number] Date of Birth: 23-May-1935  Subjective/Objective:         Spoke with patient for discharge plan, Alert oriented from home with family support.  No CM needs identified.            Action/Plan:  No CM need identified. Home with self care. Expected Discharge Date:                  Expected Discharge Plan:  Home/Self Care  In-House Referral:     Discharge planning Services  CM Consult  Post Acute Care Choice:    Choice offered to:     DME Arranged:    DME Agency:     HH Arranged:    HH Agency:     Status of Service:  Completed, signed off  Medicare Important Message Given:    Date Medicare IM Given:    Medicare IM give by:    Date Additional Medicare IM Given:    Additional Medicare Important Message give by:     If discussed at Long Length of Stay Meetings, dates discussed:    Additional Comments:  Adonis Huguenin, RN 03/30/2015, 4:47 PM

## 2015-03-31 LAB — BASIC METABOLIC PANEL
Anion gap: 10 (ref 5–15)
BUN: 28 mg/dL — AB (ref 6–20)
CALCIUM: 8.9 mg/dL (ref 8.9–10.3)
CHLORIDE: 93 mmol/L — AB (ref 101–111)
CO2: 27 mmol/L (ref 22–32)
CREATININE: 1.32 mg/dL — AB (ref 0.44–1.00)
GFR calc non Af Amer: 38 mL/min — ABNORMAL LOW (ref >60)
GFR, EST AFRICAN AMERICAN: 44 mL/min — AB (ref >60)
GLUCOSE: 99 mg/dL (ref 65–99)
Potassium: 3.8 mmol/L (ref 3.5–5.1)
Sodium: 130 mmol/L — ABNORMAL LOW (ref 135–145)

## 2015-03-31 LAB — CBC
HEMATOCRIT: 36.7 % (ref 36.0–46.0)
Hemoglobin: 12.3 g/dL (ref 12.0–15.0)
MCH: 28.5 pg (ref 26.0–34.0)
MCHC: 33.5 g/dL (ref 30.0–36.0)
MCV: 85.2 fL (ref 78.0–100.0)
Platelets: 299 10*3/uL (ref 150–400)
RBC: 4.31 MIL/uL (ref 3.87–5.11)
RDW: 13.6 % (ref 11.5–15.5)
WBC: 9.2 10*3/uL (ref 4.0–10.5)

## 2015-03-31 MED ORDER — HYDROCODONE-ACETAMINOPHEN 5-325 MG PO TABS: 1.0000 | ORAL_TABLET | Freq: Four times a day (QID) | ORAL | Status: DC | PRN

## 2015-03-31 MED ORDER — ENOXAPARIN SODIUM 40 MG/0.4ML ~~LOC~~ SOLN: 40.0000 mg | SUBCUTANEOUS | Status: DC

## 2015-03-31 NOTE — Care Management Important Message (Signed)
Important Message  Patient Details  Name: Kelli Gregory MRN: [id number] Date of Birth: 10-21-1935   Medicare Important Message Given:  Yes    Adonis Huguenin, RN 03/31/2015, 11:02 AM

## 2015-03-31 NOTE — Discharge Instructions (Signed)
Ventral Hernia A ventral hernia (also called an incisional hernia) is a hernia that occurs at the site of a previous surgical cut (incision) in the abdomen. The abdominal wall spans from your lower chest down to your pelvis. If the abdominal wall is weakened from a surgical incision, a hernia can occur. A hernia is a bulge of bowel or muscle tissue pushing out on the weakened part of the abdominal wall. Ventral hernias can get bigger from straining or lifting. Obese and older people are at higher risk for a ventral hernia. People who develop infections after surgery or require repeat incisions at the same site on the abdomen are also at increased risk. CAUSES  A ventral hernia occurs because of weakness in the abdominal wall at an incision site.  SYMPTOMS  Common symptoms include:  A visible bulge or lump on the abdominal wall.  Pain or tenderness around the lump.  Increased discomfort if you cough or make a sudden movement. If the hernia has blocked part of the intestine, a serious complication can occur (incarcerated or strangulated hernia). This can become a problem that requires emergency surgery because the blood flow to the blocked intestine may be cut off. Symptoms may include:  Feeling sick to your stomach (nauseous).  Throwing up (vomiting).  Stomach swelling (distention) or bloating.  Fever.  Rapid heartbeat. DIAGNOSIS  Your health care provider will take a medical history and perform a physical exam. Various tests may be ordered, such as:  Blood tests.  Urine tests.  Ultrasonography.  X-rays.  Computed tomography (CT). TREATMENT  Watchful waiting may be all that is needed for a smaller hernia that does not cause symptoms. Your health care provider may recommend the use of a supportive belt (truss) that helps to keep the abdominal wall intact. For larger hernias or those that cause pain, surgery to repair the hernia is usually recommended. If a hernia becomes  strangulated, emergency surgery needs to be done right away. HOME CARE INSTRUCTIONS  Avoid putting pressure or strain on the abdominal area.  Avoid heavy lifting.  Use good body positioning for physical tasks. Ask your health care provider about proper body positioning.  Use a supportive belt as directed by your health care provider.  Maintain a healthy weight.  Eat foods that are high in fiber, such as whole grains, fruits, and vegetables. Fiber helps prevent difficult bowel movements (constipation).  Drink enough fluids to keep your urine clear or pale yellow.  Follow up with your health care provider as directed. SEEK MEDICAL CARE IF:   Your hernia seems to be getting larger or more painful. SEEK IMMEDIATE MEDICAL CARE IF:   You have abdominal pain that is sudden and sharp.  Your pain becomes severe.  You have repeated vomiting.  You are sweating a lot.  You notice a rapid heartbeat.  You develop a fever. MAKE SURE YOU:   Understand these instructions.  Will watch your condition.  Will get help right away if you are not doing well or get worse.   This information is not intended to replace advice given to you by your health care provider. Make sure you discuss any questions you have with your health care provider.   Document Released: 02/05/2012 Document Revised: 03/11/2014 Document Reviewed: 02/05/2012 Elsevier Interactive Patient Education 2016 [employer]  

## 2015-03-31 NOTE — Progress Notes (Signed)
Discharged PT per MD order and protocol. Reviewed discharge teaching and handouts given. Pt verbalized understanding and left with all belongings. Pt prescriptions were given and explained. VSS. IV catheter D/C.  Patient wheeled down by staff member. Lesly Dukes, RN

## 2015-03-31 NOTE — Discharge Summary (Signed)
Physician Discharge Summary  Patient ID: Kelli Gregory MRN: [id number] DOB/AGE: 1935-10-14 80 y.o.  Admit date: 03/28/2015 Discharge date: 03/31/2015  Admission Diagnoses: Small bowel obstruction, ventral hernia  Discharge Diagnoses: Same Active Problems:   Ventral hernia with bowel obstruction  hypertension, hypothyroidism, anxiety  Discharged Condition: good  Hospital Course: Patient is a 80 year old white female who presented emergency room with a week long history of nausea, vomiting, and intermittent lower abdominal pain. CT scan the abdomen revealed incarcerated right lower quadrant ventral hernia. I was consulted on 03/28/2015. I did partially reduce the small bowel obstruction. She was brought into the hospital for control of her pain and nausea. She stopped going underwent a ventral herniorrhaphy with mesh on 03/29/2015. She tolerated the procedure well. Postoperative course was remarkable for mild upper respiratory cough. Her oxygen saturation stayed within normal limits. Her diet was advanced without difficulty. The patient is being discharged home on 03/31/2015 in good and improving condition.  Treatments: surgery: Ventral herniorrhaphy with mesh on 03/29/2015  Discharge Exam: Blood pressure 160/64, pulse 85, temperature 98.7 F (37.1 C), temperature source Oral, resp. rate 18, height  (1.651 m), weight 83.915 kg (185 lb), SpO2 100 %. General appearance: alert, cooperative and no distress Resp: clear to auscultation bilaterally Cardio: regular rate and rhythm, S1, S2 normal, no murmur, click, rub or gallop GI: Soft, incision healing well.  Disposition: 01-Home or Self Care     Medication List    TAKE these medications        citalopram 20 MG tablet  Commonly known as:  CELEXA  Take 20 mg by mouth at bedtime.     HYDROcodone-acetaminophen 5-325 MG tablet  Commonly known as:  NORCO/VICODIN  Take 1 tablet by mouth every 6 (six) hours as needed for moderate pain.     ibuprofen 800 MG tablet  Commonly known as:  ADVIL,MOTRIN  Take 800 mg by mouth every 8 (eight) hours as needed for moderate pain.     levothyroxine 88 MCG tablet  Commonly known as:  SYNTHROID, LEVOTHROID  Take 88 mcg by mouth daily before breakfast.     losartan-hydrochlorothiazide 100-25 MG tablet  Commonly known as:  HYZAAR  Take 1 tablet by mouth daily.     promethazine 25 MG suppository  Commonly known as:  PHENERGAN  Place 25 mg rectally every 4 (four) hours as needed for nausea or vomiting.     ranitidine 150 MG tablet  Commonly known as:  ZANTAC  Take 150 mg by mouth at bedtime.     sodium chloride 0.65 % nasal spray  Commonly known as:  OCEAN  Place 2 sprays into the nose at bedtime as needed for congestion.           Follow-up Information    Follow up with Dalia Heading, MD. Schedule an appointment as soon as possible for a visit on 04/06/2015.   Specialty:  General Surgery   Contact information:   [address] [place] [address] [phone]       Signed: Franky Macho A 03/31/2015, 10:33 AM

## 2015-07-17 DIAGNOSIS — I10 Essential (primary) hypertension: Secondary | ICD-10-CM | POA: Diagnosis not present

## 2015-07-17 DIAGNOSIS — Z6831 Body mass index (BMI) 31.0-31.9, adult: Secondary | ICD-10-CM | POA: Diagnosis not present

## 2015-07-17 DIAGNOSIS — F3340 Major depressive disorder, recurrent, in remission, unspecified: Secondary | ICD-10-CM | POA: Diagnosis not present

## 2016-01-30 DIAGNOSIS — I10 Essential (primary) hypertension: Secondary | ICD-10-CM | POA: Diagnosis not present

## 2016-01-30 DIAGNOSIS — N183 Chronic kidney disease, stage 3 (moderate): Secondary | ICD-10-CM | POA: Diagnosis not present

## 2016-01-30 DIAGNOSIS — K219 Gastro-esophageal reflux disease without esophagitis: Secondary | ICD-10-CM | POA: Diagnosis not present

## 2016-01-30 DIAGNOSIS — E785 Hyperlipidemia, unspecified: Secondary | ICD-10-CM | POA: Diagnosis not present

## 2016-01-30 DIAGNOSIS — F330 Major depressive disorder, recurrent, mild: Secondary | ICD-10-CM | POA: Diagnosis not present

## 2016-01-30 DIAGNOSIS — I700 Atherosclerosis of aorta: Secondary | ICD-10-CM | POA: Diagnosis not present

## 2016-01-30 DIAGNOSIS — Z79899 Other long term (current) drug therapy: Secondary | ICD-10-CM | POA: Diagnosis not present

## 2016-01-30 DIAGNOSIS — E039 Hypothyroidism, unspecified: Secondary | ICD-10-CM | POA: Diagnosis not present

## 2016-02-05 DIAGNOSIS — Z6832 Body mass index (BMI) 32.0-32.9, adult: Secondary | ICD-10-CM | POA: Diagnosis not present

## 2016-02-05 DIAGNOSIS — N183 Chronic kidney disease, stage 3 (moderate): Secondary | ICD-10-CM | POA: Diagnosis not present

## 2016-02-05 DIAGNOSIS — E785 Hyperlipidemia, unspecified: Secondary | ICD-10-CM | POA: Diagnosis not present

## 2016-02-05 DIAGNOSIS — E039 Hypothyroidism, unspecified: Secondary | ICD-10-CM | POA: Diagnosis not present

## 2016-02-05 DIAGNOSIS — I10 Essential (primary) hypertension: Secondary | ICD-10-CM | POA: Diagnosis not present

## 2016-03-27 ENCOUNTER — Other Ambulatory Visit (HOSPITAL_COMMUNITY): Payer: Self-pay | Admitting: Internal Medicine

## 2016-03-27 DIAGNOSIS — Z1231 Encounter for screening mammogram for malignant neoplasm of breast: Secondary | ICD-10-CM

## 2016-04-01 ENCOUNTER — Ambulatory Visit (INDEPENDENT_AMBULATORY_CARE_PROVIDER_SITE_OTHER): Payer: Medicare Other | Admitting: Orthopedic Surgery

## 2016-04-01 ENCOUNTER — Ambulatory Visit (INDEPENDENT_AMBULATORY_CARE_PROVIDER_SITE_OTHER): Payer: Medicare Other

## 2016-04-01 ENCOUNTER — Ambulatory Visit (HOSPITAL_COMMUNITY)
Admission: RE | Admit: 2016-04-01 | Discharge: 2016-04-01 | Disposition: A | Discharge status: Home / Self Care | Payer: Medicare Other | Source: Ambulatory Visit | Attending: Internal Medicine | Admitting: Internal Medicine

## 2016-04-01 VITALS — BP 115/69 | Ht 65.0 in | Wt 187.0 lb

## 2016-04-01 DIAGNOSIS — M25512 Pain in left shoulder: Secondary | ICD-10-CM | POA: Diagnosis not present

## 2016-04-01 DIAGNOSIS — Z1231 Encounter for screening mammogram for malignant neoplasm of breast: Secondary | ICD-10-CM | POA: Insufficient documentation

## 2016-04-01 DIAGNOSIS — M62838 Other muscle spasm: Secondary | ICD-10-CM | POA: Diagnosis not present

## 2016-04-01 DIAGNOSIS — M542 Cervicalgia: Secondary | ICD-10-CM | POA: Diagnosis not present

## 2016-04-01 MED ORDER — CYCLOBENZAPRINE HCL 10 MG PO TABS: 10.0000 mg | ORAL_TABLET | Freq: Two times a day (BID) | ORAL | 1 refills | Status: DC | PRN

## 2016-04-01 NOTE — Patient Instructions (Signed)

## 2016-04-01 NOTE — Progress Notes (Signed)
Patient ID: Kelli Gregory, female   DOB: [birth year], 81 y.o.   MRN: [id number]  Chief Complaint  Patient presents with  . Shoulder Pain    Left shoulder pain and neck pain.    HPI Kelli Gregory is a 81 y.o. female.  Presents for evaluation of left-sided shoulder pain which began in the right shoulder came across the neck and has settled in the left shoulder worse over the last 2 weeks described as a dull ache not associated with any range of motion deficits in the shoulder denies numbness or tingling timing is constant  Review of Systems Review of Systems Again numbness and tingling denied no weakness  No fever chills noted.  Past Medical History:  Diagnosis Date  . Arm fracture, left   . Arthritis   . Back pain, chronic   . Depression   . Hiatal hernia   . Hypercholesteremia   . Hypertension   . Hypothyroidism   . PONV (postoperative nausea and vomiting)     Past Surgical History:  Procedure Laterality Date  . CATARACT EXTRACTION, BILATERAL  5 yrs ago and 4 yrs   shapiro did both 1 was here and 1 in [place]  . CHOLECYSTECTOMY     2002-[facility]  . ORIF WRIST FRACTURE  10/26/2010   Procedure: OPEN REDUCTION INTERNAL FIXATION (ORIF) WRIST FRACTURE;  Surgeon: Fuller Canada, MD;  Location: [facility];  Service: Orthopedics;  Laterality: Left;  . RIGHT OOPHORECTOMY  1960  . VENTRAL HERNIA REPAIR Right 03/29/2015   Procedure: HERNIA REPAIR VENTRAL ADULT;  Surgeon: Franky Macho, MD;  Location: [facility];  Service: General;  Laterality: Right;  . WRIST FRACTURE SURGERY  11/2010   dvr plate, left wrist    Social History Social History  Substance Use Topics  . Smoking status: Never Smoker  . Smokeless tobacco: Not on file  . Alcohol use No    Allergies  Allergen Reactions  . Ciprofloxacin Other (See Comments)    Not sure   . Sulfa Antibiotics Nausea And Vomiting  . Tramadol Nausea And Vomiting  . Poison Sumac Extract Itching, Swelling and Rash    No outpatient prescriptions  have been marked as taking for the 04/01/16 encounter (Office Visit) with Vickki Hearing, MD.      Physical Exam Physical Exam BP 115/69   Ht 5\' 5"  (1.651 m)   Wt 187 lb (84.8 kg)   BMI 31.12 kg/m   Gen. appearance. The patient is well-developed and well-nourished, grooming and hygiene are normal. There are no gross congenital abnormalities  The patient is alert and oriented to person place and time  Mood and affect are normal  Ambulation normal Examination reveals the following: On inspection we find tenderness in the supraspinatus fossa the left shoulder with crepitance on range of motion which is full stability tests were normal grade 5 strength in rotator cuff skin was without rash sensation in the left arm was normal full range of motion noted in the right shoulder 2+ distal pulses in each arm   Data Reviewed X-rays of the cervical spine show subluxation C3 on C4 and C4 on C5 and degenerative disc changes C5-C7 which appear to be chronic cervical spondylosis  Left shoulder shows no arthritis or fracture  Assessment    Encounter Diagnoses  Name Primary?  . Pain in joint of left shoulder   . Neck pain   . Cervical paraspinous muscle spasm Yes       Plan  Point of maximal tenderness injection cortisone 40 mg Depo-Medrol 3 mL 1% lidocaine in the left supraspinatus fossa area with no complications noted sterile technique and ethyl chloride was used to prepare the skin  Patient will start month muscle relaxers continue heat and follow-up on an as-needed basis if there is no improvement       Fuller CanadaStanley Porshea Janowski 04/01/2016, 2:46 PM

## 2016-05-15 DIAGNOSIS — E039 Hypothyroidism, unspecified: Secondary | ICD-10-CM | POA: Diagnosis not present

## 2016-05-20 DIAGNOSIS — E039 Hypothyroidism, unspecified: Secondary | ICD-10-CM | POA: Diagnosis not present

## 2016-05-20 DIAGNOSIS — I10 Essential (primary) hypertension: Secondary | ICD-10-CM | POA: Diagnosis not present

## 2016-05-20 DIAGNOSIS — Z23 Encounter for immunization: Secondary | ICD-10-CM | POA: Diagnosis not present

## 2016-05-20 DIAGNOSIS — Z6832 Body mass index (BMI) 32.0-32.9, adult: Secondary | ICD-10-CM | POA: Diagnosis not present

## 2016-05-20 DIAGNOSIS — Z6833 Body mass index (BMI) 33.0-33.9, adult: Secondary | ICD-10-CM | POA: Diagnosis not present

## 2016-10-10 DIAGNOSIS — I10 Essential (primary) hypertension: Secondary | ICD-10-CM | POA: Diagnosis not present

## 2016-10-10 DIAGNOSIS — N390 Urinary tract infection, site not specified: Secondary | ICD-10-CM | POA: Diagnosis not present

## 2016-10-14 ENCOUNTER — Ambulatory Visit (INDEPENDENT_AMBULATORY_CARE_PROVIDER_SITE_OTHER): Payer: Medicare Other | Admitting: Orthopedic Surgery

## 2016-10-14 DIAGNOSIS — M25512 Pain in left shoulder: Secondary | ICD-10-CM | POA: Diagnosis not present

## 2016-10-14 NOTE — Patient Instructions (Signed)

## 2016-10-14 NOTE — Progress Notes (Signed)
Chief Complaint  Patient presents with  . Shoulder Pain    left shoulder wants injection     Procedure note the subacromial injection shoulder left   Verbal consent was obtained to inject the  Left   Shoulder  Timeout was completed to confirm the injection site is a subacromial space of the  left  shoulder  Medication used Depo-Medrol 40 mg and lidocaine 1% 3 cc  Anesthesia was provided by ethyl chloride  The injection was performed in the left  posterior subacromial space. After pinning the skin with alcohol and anesthetized the skin with ethyl chloride the subacromial space was injected using a 20-gauge needle. There were no complications  Sterile dressing was applied.   Encounter Diagnosis  Name Primary?  . Pain in joint of left shoulder Yes

## 2016-10-31 ENCOUNTER — Telehealth: Payer: Self-pay | Admitting: Orthopedic Surgery

## 2016-10-31 NOTE — Telephone Encounter (Signed)
Routing to Dr. Harrison to advise 

## 2016-10-31 NOTE — Telephone Encounter (Signed)
Left message for patient to return my call.

## 2016-10-31 NOTE — Telephone Encounter (Signed)
I CAN SEE HER AFTER MY VACATION TO SET UP MRI

## 2016-10-31 NOTE — Telephone Encounter (Signed)
Patient called to relay that her left shoulder pain is not getting very much better, since office visit 10/14/16, at which time injection was done. I reviewed instructions per visit summary; patient said she had done all that.   Asking if she may have another injection or if there is another recommendation. Please advise. [phone]

## 2016-11-12 NOTE — Telephone Encounter (Signed)
Patient will call back for an appointment if needed, feeling some better now.

## 2016-11-20 ENCOUNTER — Other Ambulatory Visit: Payer: Self-pay | Admitting: Orthopedic Surgery

## 2016-11-29 ENCOUNTER — Ambulatory Visit: Payer: Medicare Other | Admitting: Orthopedic Surgery

## 2016-12-26 DIAGNOSIS — I090 Rheumatic myocarditis: Secondary | ICD-10-CM | POA: Diagnosis not present

## 2016-12-26 DIAGNOSIS — Z23 Encounter for immunization: Secondary | ICD-10-CM | POA: Diagnosis not present

## 2017-02-07 DIAGNOSIS — E039 Hypothyroidism, unspecified: Secondary | ICD-10-CM | POA: Diagnosis not present

## 2017-02-07 DIAGNOSIS — I10 Essential (primary) hypertension: Secondary | ICD-10-CM | POA: Diagnosis not present

## 2017-02-07 DIAGNOSIS — N183 Chronic kidney disease, stage 3 (moderate): Secondary | ICD-10-CM | POA: Diagnosis not present

## 2017-02-07 DIAGNOSIS — Z79899 Other long term (current) drug therapy: Secondary | ICD-10-CM | POA: Diagnosis not present

## 2017-02-07 DIAGNOSIS — I700 Atherosclerosis of aorta: Secondary | ICD-10-CM | POA: Diagnosis not present

## 2017-02-07 DIAGNOSIS — E785 Hyperlipidemia, unspecified: Secondary | ICD-10-CM | POA: Diagnosis not present

## 2017-02-14 DIAGNOSIS — I700 Atherosclerosis of aorta: Secondary | ICD-10-CM | POA: Diagnosis not present

## 2017-02-14 DIAGNOSIS — I10 Essential (primary) hypertension: Secondary | ICD-10-CM | POA: Diagnosis not present

## 2017-02-14 DIAGNOSIS — E039 Hypothyroidism, unspecified: Secondary | ICD-10-CM | POA: Diagnosis not present

## 2017-02-14 DIAGNOSIS — Z6834 Body mass index (BMI) 34.0-34.9, adult: Secondary | ICD-10-CM | POA: Diagnosis not present

## 2017-04-25 ENCOUNTER — Ambulatory Visit (INDEPENDENT_AMBULATORY_CARE_PROVIDER_SITE_OTHER): Payer: Medicare Other

## 2017-04-25 ENCOUNTER — Encounter: Payer: Self-pay | Admitting: Orthopedic Surgery

## 2017-04-25 ENCOUNTER — Ambulatory Visit (INDEPENDENT_AMBULATORY_CARE_PROVIDER_SITE_OTHER): Payer: Medicare Other | Admitting: Orthopedic Surgery

## 2017-04-25 VITALS — BP 168/81 | HR 107 | Ht 67.0 in | Wt 193.0 lb

## 2017-04-25 DIAGNOSIS — M79672 Pain in left foot: Secondary | ICD-10-CM | POA: Diagnosis not present

## 2017-04-25 DIAGNOSIS — S93602A Unspecified sprain of left foot, initial encounter: Secondary | ICD-10-CM | POA: Diagnosis not present

## 2017-04-25 NOTE — Progress Notes (Signed)
Progress Note New Problem   Patient ID: Kelli Gregory, female   DOB: 12-Oct-1935, 82 y.o.   MRN: [id number]  Chief Complaint  Patient presents with  . Foot Pain    left    82 year old female fell and injured her left foot on 13 February comes in for evaluation  She complains of soreness mild discomfort dorsal aspect left foot for 8 or 9 days associated with swelling and painful weightbearing     Review of Systems  Skin: Negative.   Neurological: Negative.    Current Meds  Medication Sig  . citalopram (CELEXA) 20 MG tablet Take 20 mg by mouth at bedtime.   . hydrochlorothiazide (HYDRODIURIL) 25 MG tablet   . levothyroxine (SYNTHROID, LEVOTHROID) 100 MCG tablet   . losartan (COZAAR) 100 MG tablet     Past Medical History:  Diagnosis Date  . Arm fracture, left   . Arthritis   . Back pain, chronic   . Depression   . Hiatal hernia   . Hypercholesteremia   . Hypertension   . Hypothyroidism   . PONV (postoperative nausea and vomiting)      Allergies  Allergen Reactions  . Ciprofloxacin Other (See Comments)    Not sure   . Sulfa Antibiotics Nausea And Vomiting  . Tramadol Nausea And Vomiting  . Poison Sumac Extract Itching, Swelling and Rash    BP (!) 168/81   Pulse (!) 107   Ht 5\' 7"  (1.702 m)   Wt 193 lb (87.5 kg)   BMI 30.23 kg/m    Physical Exam  Constitutional: She is oriented to person, place, and time. She appears well-developed and well-nourished.  Neurological: She is alert and oriented to person, place, and time. Gait abnormal.  Psychiatric: She has a normal mood and affect. Judgment normal.  Vitals reviewed.   Ortho Exam Left foot tender to palpation over the dorsal aspect more on the fourth and fifth tarsometatarsal joints ankle range of motion is normal Lisfranc joint is stable muscle tone normal no atrophy plantar flexion dorsiflexion strength is normal the skin is ecchymotic but intact she has a good pulse normal sensation no lymphadenopathy  in the leg her balance is good even with the abnormal  Medical decision-making  Imaging: Plain film report is been dictated please see report there is no fracture dislocation acutely seen on the x-ray  Encounter Diagnoses  Name Primary?  . Left foot pain   . Sprain of left foot, initial encounter Yes    Sprain left foot  Patient wanted some support so we recommend hard soled shoe until pain subsides expect resolution without difficulty within 4 weeks     Fuller CanadaStanley Wes Lezotte, MD 04/25/2017 9:04 AM

## 2017-04-25 NOTE — Patient Instructions (Signed)
Wear hard sole shoe until pain subsides

## 2017-05-19 ENCOUNTER — Ambulatory Visit (INDEPENDENT_AMBULATORY_CARE_PROVIDER_SITE_OTHER): Payer: Medicare Other | Admitting: Orthopedic Surgery

## 2017-05-19 ENCOUNTER — Ambulatory Visit: Payer: Medicare Other | Admitting: Orthopedic Surgery

## 2017-05-19 ENCOUNTER — Ambulatory Visit (INDEPENDENT_AMBULATORY_CARE_PROVIDER_SITE_OTHER): Payer: Medicare Other

## 2017-05-19 ENCOUNTER — Encounter: Payer: Self-pay | Admitting: Orthopedic Surgery

## 2017-05-19 VITALS — BP 152/105 | HR 97 | Ht 67.0 in | Wt 193.0 lb

## 2017-05-19 DIAGNOSIS — S8262XA Displaced fracture of lateral malleolus of left fibula, initial encounter for closed fracture: Secondary | ICD-10-CM

## 2017-05-19 DIAGNOSIS — M25572 Pain in left ankle and joints of left foot: Secondary | ICD-10-CM

## 2017-05-19 NOTE — Progress Notes (Signed)
Progress Note   Patient ID: Kelli Gregory, female   DOB: Aug 27, 1935, 82 y.o.   MRN: [id number]  Chief Complaint  Patient presents with  . Foot Pain    Recheck on left foot.  . Ankle Pain    pain has moved into left ankle     82 year old female fell several weeks back was treated for left foot sprain after left foot x-rays were negative comes in today causing and that the pain is moved up proximal towards the ankle  The ankle x-ray shows a subacute fracture of the fibula oblique nondisplaced with intact mortise she complains of more sharp dull aching lateral ankle pain now x4 since 13 February associated with more difficulty weightbearing     Review of Systems  Constitutional: Negative for chills and fever.  Skin: Negative.   Neurological: Negative for tingling.   Current Meds  Medication Sig  . citalopram (CELEXA) 20 MG tablet Take 20 mg by mouth at bedtime.   . hydrochlorothiazide (HYDRODIURIL) 25 MG tablet   . ibuprofen (ADVIL,MOTRIN) 800 MG tablet TAKE ONE TABLET BY MOUTH EVERY 8 HOURS AS NEEDED  . levothyroxine (SYNTHROID, LEVOTHROID) 100 MCG tablet   . losartan (COZAAR) 100 MG tablet   . ranitidine (ZANTAC) 150 MG tablet Take 150 mg by mouth at bedtime.    Past Medical History:  Diagnosis Date  . Arm fracture, left   . Arthritis   . Back pain, chronic   . Depression   . Hiatal hernia   . Hypercholesteremia   . Hypertension   . Hypothyroidism   . PONV (postoperative nausea and vomiting)      Allergies  Allergen Reactions  . Ciprofloxacin Other (See Comments)    Not sure   . Sulfa Antibiotics Nausea And Vomiting  . Tramadol Nausea And Vomiting  . Poison Sumac Extract Itching, Swelling and Rash    BP (!) 152/105   Pulse 97   Ht 5\' 7"  (1.702 m)   Wt 193 lb (87.5 kg)   BMI 30.23 kg/m    Physical Exam  Constitutional: She is oriented to person, place, and time. She appears well-developed and well-nourished.  Neurological: She is alert and oriented to  person, place, and time. Gait normal.  Psychiatric: She has a normal mood and affect. Judgment normal.  Vitals reviewed.   Right Ankle Exam   Other  Erythema: absent Scars: absent Sensation: normal Pulse: present    Left Ankle Exam   Tenderness  The patient is experiencing tenderness in the lateral malleolus.  Swelling: mild  Range of Motion  The patient has normal left ankle ROM.   Muscle Strength  The patient has normal left ankle strength.  Tests  Anterior drawer: negative  Other  Erythema: absent Scars: absent Sensation: normal Pulse: present       Medical decision-making  Imaging: see report   Subacute fracture left ankle   Cast vs cam walker   Encounter Diagnoses  Name Primary?  . Acute left ankle pain Yes  . Closed displaced fracture of lateral malleolus of left fibula, initial encounter     4 weeks xr oop   wbat   Fuller CanadaStanley Harrison, MD 05/19/2017 3:00 PM

## 2017-05-21 ENCOUNTER — Telehealth: Payer: Self-pay | Admitting: Orthopedic Surgery

## 2017-05-21 DIAGNOSIS — S8265XD Nondisplaced fracture of lateral malleolus of left fibula, subsequent encounter for closed fracture with routine healing: Secondary | ICD-10-CM

## 2017-05-21 NOTE — Telephone Encounter (Signed)
faxed

## 2017-05-21 NOTE — Telephone Encounter (Signed)
Patient called to request prescription for a rolling walker with a seat. [facility].

## 2017-05-23 ENCOUNTER — Telehealth: Payer: Self-pay | Admitting: Radiology

## 2017-05-23 NOTE — Telephone Encounter (Signed)
Resent the walker order, Luster LandsbergRenee states it did not go through

## 2017-06-04 ENCOUNTER — Telehealth: Payer: Self-pay | Admitting: Orthopedic Surgery

## 2017-06-04 NOTE — Telephone Encounter (Signed)
Re-fax of order for rolling walker per request, to [facility] [place]; spoke with Leotis ShamesLauren, ph# [phone]. Faxed to [phone]. Patient aware .

## 2017-06-05 NOTE — Telephone Encounter (Signed)
As of 1:55pm yesterday, 06/04/17, [facility] confirmed receipt of orders and notes as requested.

## 2017-06-13 DIAGNOSIS — S8262XA Displaced fracture of lateral malleolus of left fibula, initial encounter for closed fracture: Secondary | ICD-10-CM | POA: Insufficient documentation

## 2017-06-16 ENCOUNTER — Ambulatory Visit (INDEPENDENT_AMBULATORY_CARE_PROVIDER_SITE_OTHER): Payer: Self-pay | Admitting: Orthopedic Surgery

## 2017-06-16 ENCOUNTER — Ambulatory Visit (INDEPENDENT_AMBULATORY_CARE_PROVIDER_SITE_OTHER): Payer: Medicare Other

## 2017-06-16 DIAGNOSIS — S8265XD Nondisplaced fracture of lateral malleolus of left fibula, subsequent encounter for closed fracture with routine healing: Secondary | ICD-10-CM

## 2017-06-16 NOTE — Progress Notes (Signed)
Fracture care follow-up  Chief Complaint  Patient presents with  . Follow-up    Recheck on left ankle fracture, DOI 04-16-17.    Encounter Diagnosis  Name Primary?  . Closed nondisplaced fracture of lateral malleolus of left fibula with routine healing, subsequent encounter 04/16/17    82 year old female initially injured her foot but subsequent x-rays and complaints of ankle pain disclosed that she had a lateral malleolus fracture  She is post injury day #61    Current Outpatient Medications:  .  citalopram (CELEXA) 20 MG tablet, Take 20 mg by mouth at bedtime. , Disp: , Rfl:  .  hydrochlorothiazide (HYDRODIURIL) 25 MG tablet, , Disp: , Rfl:  .  ibuprofen (ADVIL,MOTRIN) 800 MG tablet, TAKE ONE TABLET BY MOUTH EVERY 8 HOURS AS NEEDED, Disp: 180 tablet, Rfl: 5 .  levothyroxine (SYNTHROID, LEVOTHROID) 100 MCG tablet, , Disp: , Rfl:  .  losartan (COZAAR) 100 MG tablet, , Disp: , Rfl:  .  ranitidine (ZANTAC) 150 MG tablet, Take 150 mg by mouth at bedtime., Disp: , Rfl:   There were no vitals taken for this visit.  Physical Exam  Poor foot alignment chronic.  No pain at fracture site. Xrays: Ordered today and read today see my dictated report summarized as healing lateral malleolus fracture  Plan   Patient released no pain at fracture site

## 2017-12-01 ENCOUNTER — Ambulatory Visit (INDEPENDENT_AMBULATORY_CARE_PROVIDER_SITE_OTHER): Payer: Medicare Other | Admitting: Orthopedic Surgery

## 2017-12-01 ENCOUNTER — Encounter

## 2017-12-01 VITALS — BP 139/73 | HR 91 | Ht 67.0 in | Wt 193.0 lb

## 2017-12-01 DIAGNOSIS — M25512 Pain in left shoulder: Secondary | ICD-10-CM

## 2017-12-01 DIAGNOSIS — M719 Bursopathy, unspecified: Secondary | ICD-10-CM | POA: Diagnosis not present

## 2017-12-01 DIAGNOSIS — M67919 Unspecified disorder of synovium and tendon, unspecified shoulder: Secondary | ICD-10-CM | POA: Diagnosis not present

## 2017-12-01 MED ORDER — CYCLOBENZAPRINE HCL 10 MG PO TABS: 10.0000 mg | ORAL_TABLET | Freq: Two times a day (BID) | ORAL | 1 refills | Status: DC | PRN

## 2017-12-01 NOTE — Patient Instructions (Signed)

## 2017-12-01 NOTE — Progress Notes (Signed)
Chief Complaint  Patient presents with  . Shoulder Pain    Recheck on left shoulder.    History 82 year old female with a history of left shoulder pain and cervical disc disease with cervical spondylosis presents with recurrent pain left shoulder.  Pain located over the left shoulder dull ache associated with weakness painful range of motion pain at night pain described as a dull moderate ache.  No treatment has been given recently  Previously treated with injection  Review of systems no numbness or tingling denies neck pain  Past Medical History:  Diagnosis Date  . Arm fracture, left   . Arthritis   . Back pain, chronic   . Depression   . Hiatal hernia   . Hypercholesteremia   . Hypertension   . Hypothyroidism   . PONV (postoperative nausea and vomiting)    BP 139/73   Pulse 91   Ht 5\' 7"  (1.702 m)   Wt 193 lb (87.5 kg)   BMI 30.23 kg/m   Mood is pleasant affect is normal she is oriented x3 excellent grooming and hygiene.  Gait and station are normal.  Left shoulder tenderness posterior inferior acromion nontender over the deltoid and biceps tendon full passive range of motion painful arc of motion 120 to 150degrees shoulder stability confirmed by abduction external rotation stability weakness bilateral rotator cuffs skin normal on the left and right and cervical spine pulse and perfusion normal normal sensation left arm  No adenopathy noted supraclavicular region left shoulder   Previous x-ray done January 2018 shows subluxation of 4 on 5 extensive disc disease C5 on 6 and C6 on 7 loss of normal cervical lordosis  X-ray of the shoulder shows a type II acromion with normal glenohumeral joint and greater tuberosity abnormality cyst formation and osteopenia  Impression pain left shoulder probable impingement and rotator cuff disease  Recommend injection.  Patient requests cyclobenzaprine which worked well for her in the past  Procedure note the subacromial injection  shoulder left   Verbal consent was obtained to inject the  Left   Shoulder  Timeout was completed to confirm the injection site is a subacromial space of the  left  shoulder  Medication used Depo-Medrol 40 mg and lidocaine 1% 3 cc  Anesthesia was provided by ethyl chloride  The injection was performed in the left  posterior subacromial space. After pinning the skin with alcohol and anesthetized the skin with ethyl chloride the subacromial space was injected using a 20-gauge needle. There were no complications  Sterile dressing was applied.  Meds ordered this encounter  Medications  . cyclobenzaprine (FLEXERIL) 10 MG tablet    Sig: Take 1 tablet (10 mg total) by mouth 2 (two) times daily as needed for muscle spasms.    Dispense:  40 tablet    Refill:  1   Fu prn

## 2018-01-23 DIAGNOSIS — Z23 Encounter for immunization: Secondary | ICD-10-CM | POA: Diagnosis not present

## 2018-02-09 ENCOUNTER — Other Ambulatory Visit: Payer: Self-pay | Admitting: Radiology

## 2018-02-09 ENCOUNTER — Telehealth: Payer: Self-pay | Admitting: Orthopedic Surgery

## 2018-02-09 MED ORDER — IBUPROFEN 800 MG PO TABS: 800.0000 mg | ORAL_TABLET | Freq: Three times a day (TID) | ORAL | 5 refills | Status: DC | PRN

## 2018-02-09 NOTE — Telephone Encounter (Signed)
Called back to patient regarding her medication refill request for: Ibuprofen/ADVIL,MOTRIN) 800 MG tablet, [facility], [place]  to relay,Dr Romeo AppleHarrison has done the refill request; sent to pharmacy.

## 2018-03-17 DIAGNOSIS — K219 Gastro-esophageal reflux disease without esophagitis: Secondary | ICD-10-CM | POA: Diagnosis not present

## 2018-03-17 DIAGNOSIS — E785 Hyperlipidemia, unspecified: Secondary | ICD-10-CM | POA: Diagnosis not present

## 2018-03-17 DIAGNOSIS — I700 Atherosclerosis of aorta: Secondary | ICD-10-CM | POA: Diagnosis not present

## 2018-03-17 DIAGNOSIS — I10 Essential (primary) hypertension: Secondary | ICD-10-CM | POA: Diagnosis not present

## 2018-03-17 DIAGNOSIS — Z79899 Other long term (current) drug therapy: Secondary | ICD-10-CM | POA: Diagnosis not present

## 2018-03-17 DIAGNOSIS — N183 Chronic kidney disease, stage 3 (moderate): Secondary | ICD-10-CM | POA: Diagnosis not present

## 2018-03-17 DIAGNOSIS — E039 Hypothyroidism, unspecified: Secondary | ICD-10-CM | POA: Diagnosis not present

## 2018-03-18 ENCOUNTER — Other Ambulatory Visit: Payer: Self-pay | Admitting: Internal Medicine

## 2018-03-18 DIAGNOSIS — G8929 Other chronic pain: Secondary | ICD-10-CM

## 2018-03-18 DIAGNOSIS — M545 Low back pain: Principal | ICD-10-CM

## 2018-03-24 DIAGNOSIS — I700 Atherosclerosis of aorta: Secondary | ICD-10-CM | POA: Diagnosis not present

## 2018-03-24 DIAGNOSIS — Z6832 Body mass index (BMI) 32.0-32.9, adult: Secondary | ICD-10-CM | POA: Diagnosis not present

## 2018-03-24 DIAGNOSIS — I517 Cardiomegaly: Secondary | ICD-10-CM | POA: Diagnosis not present

## 2018-03-24 DIAGNOSIS — E039 Hypothyroidism, unspecified: Secondary | ICD-10-CM | POA: Diagnosis not present

## 2018-03-24 DIAGNOSIS — E785 Hyperlipidemia, unspecified: Secondary | ICD-10-CM | POA: Diagnosis not present

## 2018-03-24 DIAGNOSIS — F3340 Major depressive disorder, recurrent, in remission, unspecified: Secondary | ICD-10-CM | POA: Diagnosis not present

## 2018-03-24 DIAGNOSIS — N183 Chronic kidney disease, stage 3 (moderate): Secondary | ICD-10-CM | POA: Diagnosis not present

## 2018-03-24 DIAGNOSIS — I129 Hypertensive chronic kidney disease with stage 1 through stage 4 chronic kidney disease, or unspecified chronic kidney disease: Secondary | ICD-10-CM | POA: Diagnosis not present

## 2018-03-30 ENCOUNTER — Ambulatory Visit
Admission: RE | Admit: 2018-03-30 | Discharge: 2018-03-30 | Disposition: A | Discharge status: Home / Self Care | Payer: Medicare Other | Source: Ambulatory Visit | Attending: Internal Medicine | Admitting: Internal Medicine

## 2018-03-30 DIAGNOSIS — M545 Low back pain, unspecified: Secondary | ICD-10-CM

## 2018-03-30 DIAGNOSIS — G8929 Other chronic pain: Secondary | ICD-10-CM

## 2018-03-30 DIAGNOSIS — M47817 Spondylosis without myelopathy or radiculopathy, lumbosacral region: Secondary | ICD-10-CM | POA: Diagnosis not present

## 2018-03-30 MED ORDER — IOPAMIDOL (ISOVUE-M 200) INJECTION 41%
1.0000 mL | Freq: Once | INTRAMUSCULAR | Status: AC
  Administered 2018-03-30: 1 mL via EPIDURAL

## 2018-03-30 MED ORDER — METHYLPREDNISOLONE ACETATE 40 MG/ML INJ SUSP (RADIOLOG
120.0000 mg | Freq: Once | INTRAMUSCULAR | Status: AC
  Administered 2018-03-30: 120 mg via EPIDURAL

## 2018-03-30 NOTE — Discharge Instructions (Signed)

## 2018-04-17 ENCOUNTER — Other Ambulatory Visit: Payer: Self-pay | Admitting: Internal Medicine

## 2018-04-17 ENCOUNTER — Ambulatory Visit
Admission: RE | Admit: 2018-04-17 | Discharge: 2018-04-17 | Disposition: A | Discharge status: Home / Self Care | Payer: Medicare Other | Source: Ambulatory Visit | Attending: Internal Medicine | Admitting: Internal Medicine

## 2018-04-17 DIAGNOSIS — M545 Low back pain, unspecified: Secondary | ICD-10-CM

## 2018-04-17 DIAGNOSIS — G8929 Other chronic pain: Secondary | ICD-10-CM

## 2018-04-17 MED ORDER — IOPAMIDOL (ISOVUE-M 200) INJECTION 41%
1.0000 mL | Freq: Once | INTRAMUSCULAR | Status: AC
  Administered 2018-04-17: 1 mL via EPIDURAL

## 2018-04-17 MED ORDER — METHYLPREDNISOLONE ACETATE 40 MG/ML INJ SUSP (RADIOLOG
120.0000 mg | Freq: Once | INTRAMUSCULAR | Status: AC
  Administered 2018-04-17: 120 mg via EPIDURAL

## 2018-05-12 DIAGNOSIS — Z79899 Other long term (current) drug therapy: Secondary | ICD-10-CM | POA: Diagnosis not present

## 2018-05-12 DIAGNOSIS — E785 Hyperlipidemia, unspecified: Secondary | ICD-10-CM | POA: Diagnosis not present

## 2018-05-18 DIAGNOSIS — I10 Essential (primary) hypertension: Secondary | ICD-10-CM | POA: Diagnosis not present

## 2018-05-18 DIAGNOSIS — E785 Hyperlipidemia, unspecified: Secondary | ICD-10-CM | POA: Diagnosis not present

## 2018-05-27 ENCOUNTER — Other Ambulatory Visit: Payer: Self-pay | Admitting: Internal Medicine

## 2018-05-27 DIAGNOSIS — M545 Low back pain, unspecified: Secondary | ICD-10-CM

## 2018-05-27 DIAGNOSIS — G8929 Other chronic pain: Secondary | ICD-10-CM

## 2018-07-29 ENCOUNTER — Ambulatory Visit
Admission: RE | Admit: 2018-07-29 | Discharge: 2018-07-29 | Disposition: A | Discharge status: Home / Self Care | Payer: Medicare Other | Source: Ambulatory Visit | Attending: Internal Medicine | Admitting: Internal Medicine

## 2018-07-29 ENCOUNTER — Other Ambulatory Visit: Payer: Self-pay | Admitting: Internal Medicine

## 2018-07-29 ENCOUNTER — Other Ambulatory Visit: Payer: Self-pay

## 2018-07-29 DIAGNOSIS — G8929 Other chronic pain: Secondary | ICD-10-CM

## 2018-07-29 DIAGNOSIS — M545 Low back pain, unspecified: Secondary | ICD-10-CM

## 2018-07-29 DIAGNOSIS — M4696 Unspecified inflammatory spondylopathy, lumbar region: Secondary | ICD-10-CM | POA: Diagnosis not present

## 2018-07-29 DIAGNOSIS — M47816 Spondylosis without myelopathy or radiculopathy, lumbar region: Secondary | ICD-10-CM | POA: Diagnosis not present

## 2018-07-29 MED ORDER — METHYLPREDNISOLONE ACETATE 40 MG/ML INJ SUSP (RADIOLOG
120.0000 mg | Freq: Once | INTRAMUSCULAR | Status: AC
  Administered 2018-07-29: 120 mg via EPIDURAL

## 2018-07-29 MED ORDER — IOPAMIDOL (ISOVUE-M 200) INJECTION 41%
1.0000 mL | Freq: Once | INTRAMUSCULAR | Status: AC
  Administered 2018-07-29: 1 mL via EPIDURAL

## 2018-07-29 NOTE — Discharge Instructions (Signed)

## 2018-07-30 ENCOUNTER — Other Ambulatory Visit: Payer: Self-pay | Admitting: Internal Medicine

## 2018-09-21 DIAGNOSIS — I10 Essential (primary) hypertension: Secondary | ICD-10-CM | POA: Diagnosis not present

## 2018-09-21 DIAGNOSIS — N390 Urinary tract infection, site not specified: Secondary | ICD-10-CM | POA: Diagnosis not present

## 2018-09-21 DIAGNOSIS — M545 Low back pain: Secondary | ICD-10-CM | POA: Diagnosis not present

## 2018-09-23 ENCOUNTER — Other Ambulatory Visit: Payer: Self-pay | Admitting: Internal Medicine

## 2018-09-23 DIAGNOSIS — M545 Low back pain, unspecified: Secondary | ICD-10-CM

## 2018-09-23 DIAGNOSIS — G8929 Other chronic pain: Secondary | ICD-10-CM

## 2018-10-02 ENCOUNTER — Ambulatory Visit
Admission: RE | Admit: 2018-10-02 | Discharge: 2018-10-02 | Disposition: A | Discharge status: Home / Self Care | Payer: Medicare Other | Source: Ambulatory Visit | Attending: Internal Medicine | Admitting: Internal Medicine

## 2018-10-02 ENCOUNTER — Other Ambulatory Visit: Payer: Self-pay

## 2018-10-02 DIAGNOSIS — G8929 Other chronic pain: Secondary | ICD-10-CM

## 2018-10-02 DIAGNOSIS — M545 Low back pain: Secondary | ICD-10-CM | POA: Diagnosis not present

## 2018-10-02 MED ORDER — IOPAMIDOL (ISOVUE-M 200) INJECTION 41%
1.0000 mL | Freq: Once | INTRAMUSCULAR | Status: AC
  Administered 2018-10-02: 1 mL via INTRA_ARTICULAR

## 2018-10-02 MED ORDER — METHYLPREDNISOLONE ACETATE 40 MG/ML INJ SUSP (RADIOLOG
120.0000 mg | Freq: Once | INTRAMUSCULAR | Status: AC
  Administered 2018-10-02: 120 mg via INTRA_ARTICULAR

## 2018-10-02 NOTE — Discharge Instructions (Signed)

## 2018-11-16 DIAGNOSIS — J019 Acute sinusitis, unspecified: Secondary | ICD-10-CM | POA: Diagnosis not present

## 2019-01-22 DIAGNOSIS — R21 Rash and other nonspecific skin eruption: Secondary | ICD-10-CM | POA: Diagnosis not present

## 2019-01-22 DIAGNOSIS — U071 COVID-19: Secondary | ICD-10-CM | POA: Diagnosis not present

## 2019-01-22 DIAGNOSIS — R05 Cough: Secondary | ICD-10-CM | POA: Diagnosis not present

## 2019-03-17 DIAGNOSIS — I10 Essential (primary) hypertension: Secondary | ICD-10-CM | POA: Diagnosis not present

## 2019-03-17 DIAGNOSIS — U071 COVID-19: Secondary | ICD-10-CM | POA: Diagnosis not present

## 2019-03-17 DIAGNOSIS — E785 Hyperlipidemia, unspecified: Secondary | ICD-10-CM | POA: Diagnosis not present

## 2019-03-17 DIAGNOSIS — E039 Hypothyroidism, unspecified: Secondary | ICD-10-CM | POA: Diagnosis not present

## 2019-03-17 DIAGNOSIS — Z79899 Other long term (current) drug therapy: Secondary | ICD-10-CM | POA: Diagnosis not present

## 2019-03-17 DIAGNOSIS — M1990 Unspecified osteoarthritis, unspecified site: Secondary | ICD-10-CM | POA: Diagnosis not present

## 2019-03-23 DIAGNOSIS — E785 Hyperlipidemia, unspecified: Secondary | ICD-10-CM | POA: Diagnosis not present

## 2019-03-23 DIAGNOSIS — I700 Atherosclerosis of aorta: Secondary | ICD-10-CM | POA: Diagnosis not present

## 2019-03-23 DIAGNOSIS — U071 COVID-19: Secondary | ICD-10-CM | POA: Diagnosis not present

## 2019-03-23 DIAGNOSIS — I10 Essential (primary) hypertension: Secondary | ICD-10-CM | POA: Diagnosis not present

## 2019-03-23 DIAGNOSIS — M4306 Spondylolysis, lumbar region: Secondary | ICD-10-CM | POA: Diagnosis not present

## 2019-03-23 DIAGNOSIS — E039 Hypothyroidism, unspecified: Secondary | ICD-10-CM | POA: Diagnosis not present

## 2019-07-27 DIAGNOSIS — I10 Essential (primary) hypertension: Secondary | ICD-10-CM | POA: Diagnosis not present

## 2019-07-27 DIAGNOSIS — F339 Major depressive disorder, recurrent, unspecified: Secondary | ICD-10-CM | POA: Diagnosis not present

## 2019-08-13 DIAGNOSIS — Z23 Encounter for immunization: Secondary | ICD-10-CM | POA: Diagnosis not present

## 2019-09-26 IMAGING — XA Imaging study
2 series · 2 of 2 positions shown · non-contrast
Comparison: none

CLINICAL DATA: Lumbosacral spondylosis without myelopathy. No
significant proven from the last injection. We went at the L3-4
level today.

[Series 1: ortho standard · 1 of 1 slices shown (1 of 2)]
[im 1/1]
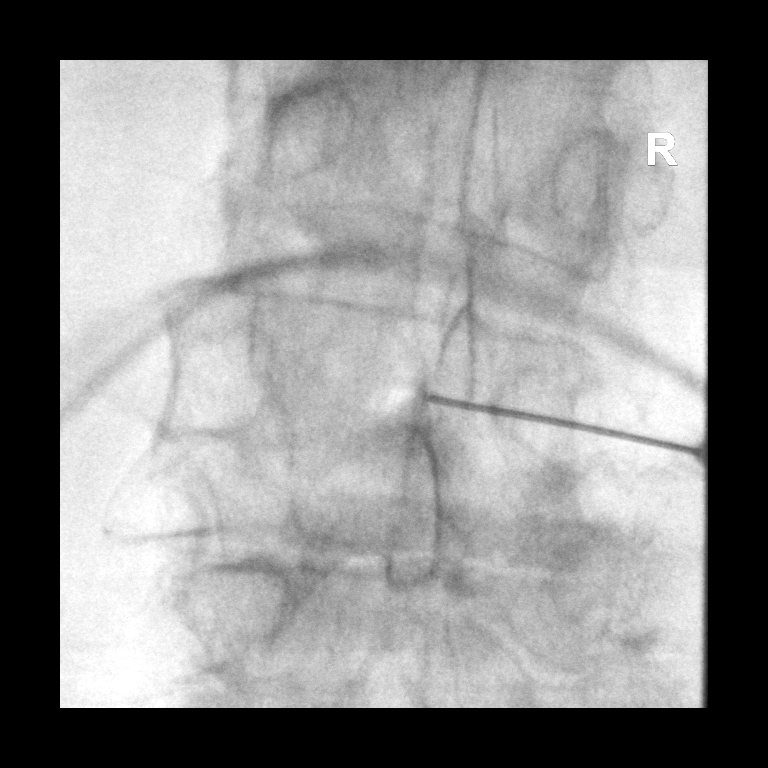

[Series 3: ortho standard · 1 of 1 slices shown (2 of 2)]
[im 1/1]
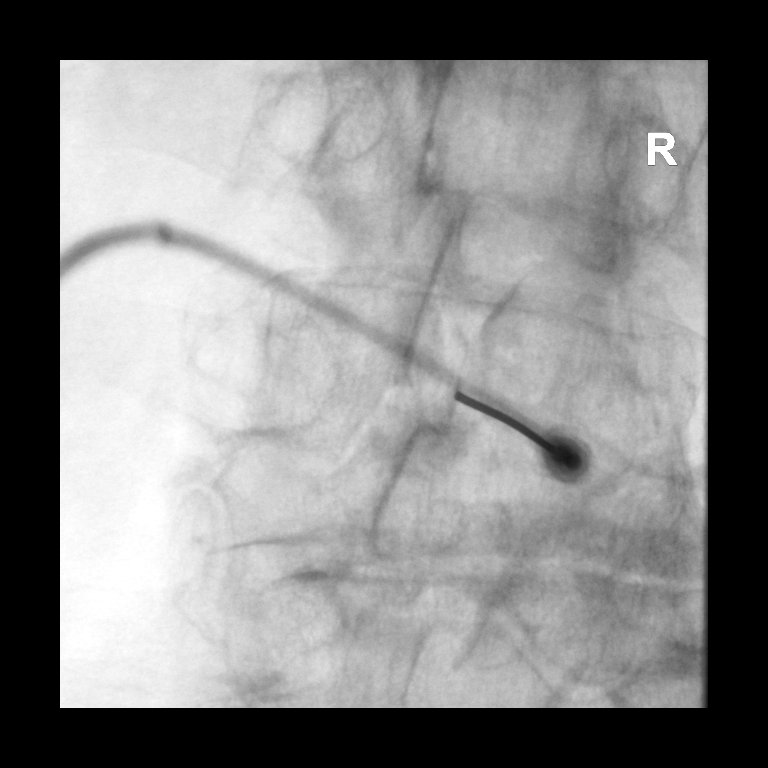

[2 of 2 positions shown; findings below may reference images not displayed]

FLUOROSCOPY TIME:  37 seconds corresponding to a Dose Area Product
of 61.71 Gy*m2

PROCEDURE:
The procedure, risks, benefits, and alternatives were explained to
the patient. Questions regarding the procedure were encouraged and
answered. The patient understands and consents to the procedure.

LUMBAR EPIDURAL INJECTION:

An interlaminar approach was performed on RIGHT at L3-L4. The
overlying skin was cleansed and anesthetized. A 20 gauge epidural
needle was advanced using loss-of-resistance technique.

DIAGNOSTIC EPIDURAL INJECTION:

Injection of Isovue-M 200 shows a good epidural pattern with spread
above and below the level of needle placement, primarily on the
RIGHT; no vascular opacification is seen.

THERAPEUTIC EPIDURAL INJECTION:

120.0 mg of Depo-Medrol mixed with 2 mL 1% lidocaine were instilled.
The procedure was well-tolerated, and the patient was discharged
thirty minutes following the injection in good condition.

COMPLICATIONS:
None
IMPRESSION: Technically successful epidural injection on the RIGHT L3-L4 # 2.

## 2019-12-07 DIAGNOSIS — N390 Urinary tract infection, site not specified: Secondary | ICD-10-CM | POA: Diagnosis not present

## 2019-12-07 DIAGNOSIS — I10 Essential (primary) hypertension: Secondary | ICD-10-CM | POA: Diagnosis not present

## 2020-01-17 DIAGNOSIS — I10 Essential (primary) hypertension: Secondary | ICD-10-CM | POA: Diagnosis not present

## 2020-01-17 DIAGNOSIS — F329 Major depressive disorder, single episode, unspecified: Secondary | ICD-10-CM | POA: Diagnosis not present

## 2020-01-17 DIAGNOSIS — Z79899 Other long term (current) drug therapy: Secondary | ICD-10-CM | POA: Diagnosis not present

## 2020-03-30 ENCOUNTER — Other Ambulatory Visit: Payer: Self-pay | Admitting: Internal Medicine

## 2020-03-30 DIAGNOSIS — M545 Low back pain, unspecified: Secondary | ICD-10-CM

## 2020-04-10 ENCOUNTER — Ambulatory Visit: Payer: Medicare Other | Admitting: Orthopedic Surgery

## 2020-04-11 ENCOUNTER — Ambulatory Visit
Admission: RE | Admit: 2020-04-11 | Discharge: 2020-04-11 | Disposition: A | Discharge status: Home / Self Care | Payer: Medicare Other | Source: Ambulatory Visit | Attending: Internal Medicine | Admitting: Internal Medicine

## 2020-04-11 ENCOUNTER — Other Ambulatory Visit: Payer: Self-pay | Admitting: Internal Medicine

## 2020-04-11 DIAGNOSIS — M545 Low back pain, unspecified: Secondary | ICD-10-CM

## 2020-04-11 MED ORDER — IOPAMIDOL (ISOVUE-M 200) INJECTION 41%
1.0000 mL | Freq: Once | INTRAMUSCULAR | Status: AC
  Administered 2020-04-11: 1 mL via INTRA_ARTICULAR

## 2020-04-11 MED ORDER — METHYLPREDNISOLONE ACETATE 40 MG/ML INJ SUSP (RADIOLOG
120.0000 mg | Freq: Once | INTRAMUSCULAR | Status: AC
  Administered 2020-04-11: 120 mg via INTRA_ARTICULAR

## 2020-04-11 NOTE — Discharge Instructions (Signed)

## 2020-07-24 ENCOUNTER — Ambulatory Visit (INDEPENDENT_AMBULATORY_CARE_PROVIDER_SITE_OTHER): Payer: Medicare Other | Admitting: Orthopedic Surgery

## 2020-07-24 ENCOUNTER — Other Ambulatory Visit: Payer: Self-pay

## 2020-07-24 ENCOUNTER — Ambulatory Visit: Payer: Medicare Other

## 2020-07-24 ENCOUNTER — Encounter: Payer: Self-pay | Admitting: Orthopedic Surgery

## 2020-07-24 VITALS — Ht 67.0 in | Wt 185.0 lb

## 2020-07-24 DIAGNOSIS — M25562 Pain in left knee: Secondary | ICD-10-CM

## 2020-07-24 DIAGNOSIS — M19012 Primary osteoarthritis, left shoulder: Secondary | ICD-10-CM

## 2020-07-24 DIAGNOSIS — M25312 Other instability, left shoulder: Secondary | ICD-10-CM

## 2020-07-24 DIAGNOSIS — M19019 Primary osteoarthritis, unspecified shoulder: Secondary | ICD-10-CM

## 2020-07-24 MED ORDER — IBUPROFEN 800 MG PO TABS: 800.0000 mg | ORAL_TABLET | Freq: Three times a day (TID) | ORAL | 5 refills | Status: DC | PRN

## 2020-07-24 NOTE — Progress Notes (Addendum)
Chief Complaint  Patient presents with  . Shoulder Pain    Left shoulder pain, Patient requesting injection.   Actually 2 problems today 1 patient has chronic left shoulder pain chronic rotator cuff insufficiency request injection left shoulder  To 3 weeks ago patient thinks she may have twisted her left knee complains of left knee pain lateral joint and peripatellar region no catching locking or giving way  Review of systems joint pain back pain no fever chills or skin rashes  Past Medical History:  Diagnosis Date  . Arm fracture, left   . Arthritis   . Back pain, chronic   . Depression   . Hiatal hernia   . Hypercholesteremia   . Hypertension   . Hypothyroidism   . PONV (postoperative nausea and vomiting)     Ht 5\' 7"  (1.702 m)   Wt 185 lb (83.9 kg)   BMI 28.98 kg/m   Exam shows she is awake alert and oriented x3  Mood and affect are normal  Patient is ambulatory without any assistive device no limp  Left knee is tender over the peripatellar region lateral compartment no pain with range of motion or meniscal signs  Left shoulder exam shows rotator cuff weakness but full passive range of motion with some pain no external rotation deficits  X-rays of the left knee show arthritis with a seven 8 degree valgus knee peripatellar region shows mild degenerative changes as well  Impression.dx  Encounter Diagnoses  Name Primary?  . Acute pain of left knee Yes  . Rotator cuff insufficiency of left shoulder   . Arthritis, shoulder region     Procedure note the subacromial injection shoulder left   Verbal consent was obtained to inject the  Left   Shoulder  Timeout was completed to confirm the injection site is a subacromial space of the  left  shoulder  Medication used Celestone 6 mg lidocaine 1% 2 cc Anesthesia was provided by ethyl chloride  The injection was performed in the left  posterior subacromial space. After pinning the skin with alcohol and anesthetized the  skin with ethyl chloride the subacromial space was injected using a 20-gauge needle. There were no complications  Sterile dressing was applied.   Addendum  07/26/2020 Injection left knee  Timeout verbal consent was obtained inject left knee medication Celestone 6 mg 1 cc of lidocaine 2% No complications were noted use ethyl chloride and alcohol to prep the skin

## 2020-07-24 NOTE — Patient Instructions (Signed)

## 2021-03-30 ENCOUNTER — Other Ambulatory Visit: Payer: Self-pay

## 2021-03-30 ENCOUNTER — Ambulatory Visit (HOSPITAL_COMMUNITY)
Admission: RE | Admit: 2021-03-30 | Discharge: 2021-03-30 | Disposition: A | Discharge status: Home / Self Care | Payer: Medicare Other | Source: Ambulatory Visit | Attending: Internal Medicine | Admitting: Internal Medicine

## 2021-03-30 ENCOUNTER — Other Ambulatory Visit (HOSPITAL_COMMUNITY): Payer: Self-pay | Admitting: Internal Medicine

## 2021-03-30 DIAGNOSIS — R059 Cough, unspecified: Secondary | ICD-10-CM

## 2022-01-10 ENCOUNTER — Other Ambulatory Visit: Payer: Self-pay | Admitting: Internal Medicine

## 2022-01-10 DIAGNOSIS — M545 Low back pain, unspecified: Secondary | ICD-10-CM

## 2022-01-15 ENCOUNTER — Other Ambulatory Visit: Payer: Self-pay | Admitting: Internal Medicine

## 2022-01-15 ENCOUNTER — Ambulatory Visit
Admission: RE | Admit: 2022-01-15 | Discharge: 2022-01-15 | Disposition: A | Discharge status: Home / Self Care | Payer: Medicare Other | Source: Ambulatory Visit | Attending: Internal Medicine | Admitting: Internal Medicine

## 2022-01-15 DIAGNOSIS — G8929 Other chronic pain: Secondary | ICD-10-CM

## 2022-01-15 MED ORDER — METHYLPREDNISOLONE ACETATE 40 MG/ML INJ SUSP (RADIOLOG
80.0000 mg | Freq: Once | INTRAMUSCULAR | Status: AC
  Administered 2022-01-15: 80 mg via INTRA_ARTICULAR

## 2022-01-15 MED ORDER — IOPAMIDOL (ISOVUE-M 200) INJECTION 41%
1.0000 mL | Freq: Once | INTRAMUSCULAR | Status: AC
  Administered 2022-01-15: 1 mL via INTRA_ARTICULAR

## 2022-01-15 NOTE — Discharge Instructions (Signed)

## 2022-02-28 ENCOUNTER — Other Ambulatory Visit: Payer: Self-pay | Admitting: Internal Medicine

## 2022-02-28 DIAGNOSIS — M47816 Spondylosis without myelopathy or radiculopathy, lumbar region: Secondary | ICD-10-CM

## 2022-09-03 ENCOUNTER — Other Ambulatory Visit (HOSPITAL_COMMUNITY): Payer: Self-pay | Admitting: Internal Medicine

## 2022-09-03 DIAGNOSIS — R062 Wheezing: Secondary | ICD-10-CM

## 2022-09-04 ENCOUNTER — Ambulatory Visit (HOSPITAL_COMMUNITY)
Admission: RE | Admit: 2022-09-04 | Discharge: 2022-09-04 | Disposition: A | Discharge status: Home / Self Care | Payer: Medicare Other | Source: Ambulatory Visit | Attending: Internal Medicine | Admitting: Internal Medicine

## 2022-09-04 DIAGNOSIS — R062 Wheezing: Secondary | ICD-10-CM | POA: Insufficient documentation

## 2022-10-08 ENCOUNTER — Other Ambulatory Visit (HOSPITAL_COMMUNITY): Payer: Self-pay | Admitting: Internal Medicine

## 2022-10-08 ENCOUNTER — Ambulatory Visit (HOSPITAL_COMMUNITY)
Admission: RE | Admit: 2022-10-08 | Discharge: 2022-10-08 | Disposition: A | Discharge status: Home / Self Care | Payer: Medicare Other | Source: Ambulatory Visit | Attending: Internal Medicine | Admitting: Internal Medicine

## 2022-10-08 DIAGNOSIS — R059 Cough, unspecified: Secondary | ICD-10-CM | POA: Insufficient documentation

## 2022-11-05 ENCOUNTER — Other Ambulatory Visit (HOSPITAL_COMMUNITY): Payer: Self-pay | Admitting: Internal Medicine

## 2022-11-05 DIAGNOSIS — Z2089 Contact with and (suspected) exposure to other communicable diseases: Secondary | ICD-10-CM

## 2022-11-07 ENCOUNTER — Ambulatory Visit (HOSPITAL_COMMUNITY)
Admission: RE | Admit: 2022-11-07 | Discharge: 2022-11-07 | Disposition: A | Discharge status: Home / Self Care | Payer: Medicare Other | Source: Ambulatory Visit | Attending: Internal Medicine | Admitting: Internal Medicine

## 2022-11-07 DIAGNOSIS — Z2089 Contact with and (suspected) exposure to other communicable diseases: Secondary | ICD-10-CM | POA: Insufficient documentation

## 2022-11-18 ENCOUNTER — Other Ambulatory Visit (HOSPITAL_COMMUNITY): Payer: Self-pay | Admitting: Internal Medicine

## 2022-11-18 DIAGNOSIS — R911 Solitary pulmonary nodule: Secondary | ICD-10-CM

## 2022-11-26 ENCOUNTER — Ambulatory Visit (HOSPITAL_COMMUNITY)
Admission: RE | Admit: 2022-11-26 | Discharge: 2022-11-26 | Disposition: A | Discharge status: Home / Self Care | Payer: Medicare Other | Source: Ambulatory Visit | Attending: Internal Medicine | Admitting: Internal Medicine

## 2022-11-26 ENCOUNTER — Other Ambulatory Visit (HOSPITAL_COMMUNITY): Payer: Self-pay | Admitting: Internal Medicine

## 2022-11-26 DIAGNOSIS — J189 Pneumonia, unspecified organism: Secondary | ICD-10-CM | POA: Insufficient documentation

## 2022-12-03 ENCOUNTER — Ambulatory Visit (HOSPITAL_COMMUNITY): Payer: PRIVATE HEALTH INSURANCE

## 2022-12-09 LAB — LAB REPORT - SCANNED: EGFR: 49

## 2022-12-13 ENCOUNTER — Ambulatory Visit (HOSPITAL_COMMUNITY)
Admission: RE | Admit: 2022-12-13 | Discharge: 2022-12-13 | Disposition: A | Discharge status: Home / Self Care | Payer: Medicare Other | Source: Ambulatory Visit | Attending: Internal Medicine | Admitting: Internal Medicine

## 2022-12-13 DIAGNOSIS — R911 Solitary pulmonary nodule: Secondary | ICD-10-CM | POA: Insufficient documentation

## 2022-12-13 MED ORDER — IOHEXOL 300 MG/ML  SOLN
75.0000 mL | Freq: Once | INTRAMUSCULAR | Status: AC | PRN
  Administered 2022-12-13: 75 mL via INTRAVENOUS

## 2023-01-03 ENCOUNTER — Other Ambulatory Visit (HOSPITAL_COMMUNITY): Payer: PRIVATE HEALTH INSURANCE

## 2023-01-09 ENCOUNTER — Institutional Professional Consult (permissible substitution): Payer: PRIVATE HEALTH INSURANCE | Admitting: Internal Medicine

## 2023-02-11 NOTE — Progress Notes (Addendum)
Kelli Gregory, female    DOB: February 27, 1936    MRN: [id number]   Brief patient profile:  89  yowf  never smoker with no prior resp issues but  "never right since covid infection x 3 and shots x 2"  referred to pulmonary clinic in [place]  02/12/2023 by Dr Kelli Gregory  for ? new onset cough around late June  2024 dx as CAP and  rx with levaquin > not improved and subsequent itchy rash in late July 2024 > bx in [place] 12/06/22 c/w drug reacion  and started on dupixent and prednisone since 1st week in [place] 2024 ? Maybe a little better      History of Present Illness  02/12/2023  Pulmonary/ 1st office eval/ Kelli Gregory / [facility] dupixent first shots 02/08/23 / prednisone on day 3= 40 mg  Chief Complaint  Patient presents with   Establish Care   Pulmonary Nodules    CT Chest 12/13/22  Dyspnea:  no problem pushing cart around [employer]  Cough: slt rattling non-productive worse at hs and then all night  Sleep: bed is flat one pillow  SABA use: none  02: none  No obvious day to day or daytime pattern/variability or assoc excess/ purulent sputum or mucus plugs or hemoptysis or cp or chest tightness, subjective wheeze or overt sinus or hb symptoms.    Also denies any obvious fluctuation of symptoms with weather or environmental changes or other aggravating or alleviating factors except as outlined above   No unusual exposure hx or h/o childhood pna/ asthma or knowledge of premature birth.  Current Allergies, Complete Past Medical History, Past Surgical History, Family History, and Social History were reviewed in [name] record.  ROS  The following are not active complaints unless bolded Hoarseness, sore throat, dysphagia, dental problems, itching, sneezing,  nasal congestion or discharge of excess mucus or purulent secretions, ear ache,   fever, chills, sweats, unintended wt loss or wt gain, classically pleuritic or exertional cp,  orthopnea pnd or arm/hand swelling  or  leg swelling, presyncope, palpitations, abdominal pain, anorexia, nausea, vomiting, diarrhea  or change in bowel habits or change in bladder habits, change in stools or change in urine, dysuria, hematuria,  rash, arthralgias(nothing new for years) , visual complaints, headache, numbness, weakness or ataxia or problems with walking or coordination,  change in mood or  memory.               Outpatient Medications Prior to Visit  Medication Sig Dispense Refill   amLODipine (NORVASC) 2.5 MG tablet Take 2.5 mg by mouth daily.     cetirizine (ZYRTEC) 10 MG tablet Take 10 mg by mouth every morning.     citalopram (CELEXA) 20 MG tablet Take 20 mg by mouth at bedtime.     DUPIXENT 300 MG/2ML SOAJ      famotidine (PEPCID) 20 MG tablet Take 20 mg by mouth daily.     hydrochlorothiazide (HYDRODIURIL) 25 MG tablet      levothyroxine (SYNTHROID) 112 MCG tablet Take 112 mcg by mouth daily.     losartan (COZAAR) 100 MG tablet      predniSONE (DELTASONE) 10 MG tablet Take 10 mg by mouth.     rosuvastatin (CRESTOR) 5 MG tablet TAKE 1 TABLET BY MOUTH TWICE A WEEK     triamcinolone cream (KENALOG) 0.1 % Apply 1 Application topically 2 (two) times daily.     VENTOLIN HFA 108 (90 Base) MCG/ACT inhaler Inhale 2  puffs into the lungs every 6 (six) hours as needed.     hydrOXYzine (ATARAX) 25 MG tablet Take 25 mg by mouth at bedtime as needed.     amLODipine (NORVASC) 5 MG tablet Take 1 tablet by mouth daily.     cyclobenzaprine (FLEXERIL) 10 MG tablet Take 1 tablet (10 mg total) by mouth 2 (two) times daily as needed for muscle spasms. 40 tablet 1   ibuprofen (ADVIL) 800 MG tablet Take 1 tablet (800 mg total) by mouth every 8 (eight) hours as needed. 180 tablet 5   levothyroxine (SYNTHROID, LEVOTHROID) 100 MCG tablet      ranitidine (ZANTAC) 150 MG tablet Take 150 mg by mouth at bedtime.     No facility-administered medications prior to visit.    Past Medical History:  Diagnosis Date   Arm fracture, left     Arthritis    Back pain, chronic    Depression    Hiatal hernia    Hypercholesteremia    Hypertension    Hypothyroidism    PONV (postoperative nausea and vomiting)       Objective:     BP 119/74   Pulse (!) 107   Ht 5\' 7"  (1.702 m)   Wt 170 lb (77.1 kg)   SpO2 94%   BMI 26.63 kg/m   SpO2: 94 % RA   Amb elderly wf nad    HEENT : Oropharynx  clear      Nasal turbinates nl    NECK :  without  apparent JVD/ palpable Nodes/TM    LUNGS: no acc muscle use,  Nl contour Junky coarse bs s true wheeze  bilaterally without cough on insp or exp maneuvers   CV:  RRR  no s3 or murmur or increase in P2, and no edema   ABD:  soft and nontender with nl inspiratory excursion in the supine position. No bruits or organomegaly appreciated   MS:  Nl gait/ ext warm without deformities Or obvious joint restrictions  calf tenderness, cyanosis or clubbing    SKIN: warm and dry with punctate diffuse erythematous rash isolted to back to torso  NEURO:  alert, approp, nl sensorium with  no motor or cerebellar deficits apparent.     I personally reviewed images and agree with radiology impression as follows:   Chest CT w contrast 12/13/22        1. Peripheral predominant reticular opacities with subpleural sparing and traction bronchiectasis/bronchiolectasis. Findings are consistent with interstitial lung disease, pattern most consistent with fibrotic NSIP. Chronic sequela of prior COVID 19 pneumonia is an additional consideration. Findings are suggestive of an alternative diagnosis (not UIP)   2. Pulmonary nodules, largest is located in the right lower lobe and measures 8 mm in mean diameter.   3. Dilated main pulmonary artery, which can be seen in the setting of pulmonary hypertension. 4. Moderate hiatal hernia.  Assessment   DOE (dyspnea on exertion) Assoc with non specific findings on CT chest: Miscellaneous:Alv microlithiasis, alv proteinosis, asp, bronchiectais, BOOP   ARDS/  AIP Occupational dz/ HSP Neoplasm Infection Drug  Pulmonary emboli, Protein disorders Edema/Eosinophilic dz Sarcoidosis Connective tissue dz esp  ANCA)  Hist X / Hemorrhage Idiopathic / NSIP   Most likely dx's are bolded  I really don't have a better idea here than the above ddx and have started labs to rule out the obvious and let her dupixent and prednisone have full effect then f/u in 4 weeks, sooner if losing ground.  Each maintenance medication was reviewed in detail including emphasizing most importantly the difference between maintenance and prns and under what circumstances the prns are to be triggered using an action plan format where appropriate.  Total time for H and P, chart review, counseling,   and generating customized AVS unique to this office visit / same day charting = 45 min with  pt new to me           Sandrea Hughs, MD 02/12/2023

## 2023-02-12 ENCOUNTER — Ambulatory Visit (INDEPENDENT_AMBULATORY_CARE_PROVIDER_SITE_OTHER): Payer: Medicare Other | Admitting: Internal Medicine

## 2023-02-12 ENCOUNTER — Encounter: Payer: Self-pay | Admitting: Internal Medicine

## 2023-02-12 VITALS — BP 119/74 | HR 107 | Ht 67.0 in | Wt 170.0 lb

## 2023-02-12 DIAGNOSIS — R0609 Other forms of dyspnea: Secondary | ICD-10-CM | POA: Insufficient documentation

## 2023-02-12 NOTE — Patient Instructions (Addendum)
Need a fax of the skin biopsy report and last office note from your dermatologist in [place]  For cough/ congestion >   Mucinex dm  up to maximum of  1200 mg every 12 hours as needed   For drainage / throat tickle try take CHLORPHENIRAMINE  4 mg  ("Allergy Relief" 4mg   at [facility] should be easiest to find in the green or blue box usually on bottom shelf  start with just a dose or two an hour before bedtime along with another pepcid 20 mg.  If condition gets worse recommend go to [facility] ER   Please remember to go to the lab department   for your tests - we will call you with the results when they are available.   Please schedule a follow up office visit in 4 weeks, sooner if needed  with all medications /inhalers/ solutions in hand so we can verify exactly what you are taking. This includes all medications from all doctors and over the counters

## 2023-02-12 NOTE — Assessment & Plan Note (Addendum)
Assoc with non specific findings on CT chest 12/13/22  Miscellaneous:Alv microlithiasis, alv proteinosis, asp, bronchiectais, BOOP   ARDS/ AIP Occupational dz/ HSP Neoplasm Infection Drug  Pulmonary emboli, Protein disorders Edema/Eosinophilic dz Sarcoidosis Connective tissue dz esp  ANCA)  Hist X / Hemorrhage Idiopathic / NSIP   Most likely dx's are bolded  I really don't have a better idea here than the above ddx and have started labs to rule out the obvious and let her dupixent and prednisone have full effect then f/u in 4 weeks, sooner if losing ground with all meds in hand using a trust but verify approach to confirm accurate Medication  Reconciliation The principal here is that until we are certain that the  patients are doing what we've asked, it makes no sense to ask them to do more.      Each maintenance medication was reviewed in detail including emphasizing most importantly the difference between maintenance and prns and under what circumstances the prns are to be triggered using an action plan format where appropriate.  Total time for H and P, chart review, counseling,   and generating customized AVS unique to this office visit / same day charting = 45 min with  pt new to me

## 2023-02-15 LAB — CBC WITH DIFFERENTIAL/PLATELET
Basophils Absolute: 0 10*3/uL (ref 0.0–0.2)
Basos: 0 %
EOS (ABSOLUTE): 0.1 10*3/uL (ref 0.0–0.4)
Eos: 4 %
Hematocrit: 43.3 % (ref 34.0–46.6)
Hemoglobin: 14.2 g/dL (ref 11.1–15.9)
Immature Grans (Abs): 0 10*3/uL (ref 0.0–0.1)
Immature Granulocytes: 0 %
Lymphocytes Absolute: 1 10*3/uL (ref 0.7–3.1)
Lymphs: 36 %
MCH: 27 pg (ref 26.6–33.0)
MCHC: 32.8 g/dL (ref 31.5–35.7)
MCV: 82 fL (ref 79–97)
Monocytes Absolute: 0.5 10*3/uL (ref 0.1–0.9)
Monocytes: 17 %
Neutrophils Absolute: 1.1 10*3/uL — ABNORMAL LOW (ref 1.4–7.0)
Neutrophils: 43 %
Platelets: 120 10*3/uL — ABNORMAL LOW (ref 150–450)
RBC: 5.26 x10E6/uL (ref 3.77–5.28)
RDW: 13.4 % (ref 11.7–15.4)
WBC: 2.7 10*3/uL — ABNORMAL LOW (ref 3.4–10.8)

## 2023-02-15 LAB — QUANTIFERON-TB GOLD PLUS
QuantiFERON Mitogen Value: 0.27 [IU]/mL
QuantiFERON Nil Value: 0.01 [IU]/mL
QuantiFERON TB1 Ag Value: 0 [IU]/mL
QuantiFERON TB2 Ag Value: 0 [IU]/mL
QuantiFERON-TB Gold Plus: UNDETERMINED — AB

## 2023-02-15 LAB — ANCA TITERS
Atypical pANCA: <1:20 {titer}
C-ANCA: <1:20 {titer}
P-ANCA: <1:20 {titer}

## 2023-02-15 LAB — IGE: IgE (Immunoglobulin E), Serum: 1043 [IU]/mL — ABNORMAL HIGH (ref 6–495)

## 2023-02-15 LAB — SEDIMENTATION RATE: Sed Rate: 5 mm/h (ref 0–40)

## 2023-02-15 LAB — BRAIN NATRIURETIC PEPTIDE: BNP: 174.6 pg/mL — ABNORMAL HIGH (ref 0.0–100.0)

## 2023-02-15 LAB — D-DIMER, QUANTITATIVE: D-DIMER: 1.89 mg{FEU}/L — ABNORMAL HIGH (ref 0.00–0.49)

## 2023-02-19 ENCOUNTER — Encounter: Payer: Self-pay | Admitting: Internal Medicine

## 2023-02-19 DIAGNOSIS — R21 Rash and other nonspecific skin eruption: Secondary | ICD-10-CM | POA: Insufficient documentation

## 2023-02-21 ENCOUNTER — Institutional Professional Consult (permissible substitution): Payer: PRIVATE HEALTH INSURANCE | Admitting: Internal Medicine

## 2023-03-20 NOTE — Progress Notes (Addendum)
Kelli Gregory, female    DOB: [birth year]    MRN: [id number]   Brief patient profile:  68  yowf  never smoker with no prior resp issues but  "never right since covid infection x 3 and shots x 2"  referred to pulmonary clinic in [place]  02/12/2023 by Dr Ouida Sills  for ? new onset cough around late June  2024 dx as CAP and  rx with levaquin > not improved and subsequent itchy rash in late July 2024 > bx in [place] 12/06/22 c/w drug reacion  and started on dupixent and prednisone since 1st week in [place] 2024 ? Maybe a little better   Skin bx Dec 06 2022  "lymphcytic infiltrated with Eos c/w drug eruption" > rx dupixent per derm since 02/08/23    History of Present Illness  02/12/2023  Pulmonary/ 1st office eval/ Daiel Strohecker / [place] Office dupixent first shots 02/08/23 / prednisone on day 3= 40 mg  Chief Complaint  Patient presents with   Establish Care   Pulmonary Nodules    CT Chest 12/13/22  Dyspnea:  no problem pushing cart around [employer]  Cough: slt rattling non-productive worse at hs and then all night  Sleep: bed is flat one pillow  SABA use: none  02: none Rec For cough/ congestion >   Mucinex dm  up to maximum of  1200 mg every 12 hours as needed  For drainage / throat tickle try take CHLORPHENIRAMINE  4 mg  along with another pepcid 20 mg. If condition gets worse recommend go to [facility] ER   Please schedule a follow up office visit in 4 weeks, sooner if needed  with all medications /inhalers/ solutions in hand    03/21/2023  f/u ov/[place] office/Kelli Gregory re: cough  maint on dupixent / zyrtec  10 mg each am x 6 m planned and pepcid bid / no more prednisone/ itching /rash resolved Dyspnea:  pushes cart at [employer]  Cough: yellow thick mucus zpak  Sleeping: flat bed/ one pillow  bad cough  x  SABA use: not using  02: none    No obvious day to day or daytime variability or assoc  mucus plugs or hemoptysis or cp or chest tightness, subjective wheeze or overt sinus or hb symptoms.     Also denies any obvious fluctuation of symptoms with weather or environmental changes or other aggravating or alleviating factors except as outlined above   No unusual exposure hx or h/o childhood pna/ asthma or knowledge of premature birth.  Current Allergies, Complete Past Medical History, Past Surgical History, Family History, and Social History were reviewed in [name] record.  ROS  The following are not active complaints unless bolded Hoarseness, sore throat, dysphagia, dental problems, itching, sneezing,  nasal congestion or discharge of excess mucus or purulent secretions, ear ache,   fever, chills, sweats, unintended wt loss or wt gain, classically pleuritic or exertional cp,  orthopnea pnd or arm/hand swelling  or leg swelling, presyncope, palpitations, abdominal pain, anorexia, nausea, vomiting, diarrhea  or change in bowel habits or change in bladder habits, change in stools or change in urine, dysuria, hematuria,  rash better, arthralgias, visual complaints, headache, numbness, weakness or ataxia or problems with walking/ uses cane R arm or coordination,  change in mood or  memory.        Current Meds  Medication Sig   amLODipine (NORVASC) 2.5 MG tablet Take 2.5 mg by mouth daily.   azithromycin (  ZITHROMAX) 250 MG tablet Take 250 mg by mouth as directed.   cetirizine (ZYRTEC) 10 MG tablet Take 10 mg by mouth every morning.   citalopram (CELEXA) 20 MG tablet Take 20 mg by mouth at bedtime.   DUPIXENT 300 MG/2ML SOAJ    famotidine (PEPCID) 20 MG tablet One after supper   hydrochlorothiazide (HYDRODIURIL) 25 MG tablet    levothyroxine (SYNTHROID) 112 MCG tablet Take 112 mcg by mouth daily.   losartan (COZAAR) 100 MG tablet    pantoprazole (PROTONIX) 40 MG tablet Take 1 tablet (40 mg total) by mouth daily. Take 30-60 min before first meal of the day   rosuvastatin (CRESTOR) 5 MG tablet TAKE 1 TABLET BY MOUTH TWICE A WEEK   triamcinolone cream (KENALOG)  0.1 % Apply 1 Application topically 2 (two) times daily.   VENTOLIN HFA 108 (90 Base) MCG/ACT inhaler Inhale 2 puffs into the lungs every 6 (six) hours as needed.           Past Medical History:  Diagnosis Date   Arm fracture, left    Arthritis    Back pain, chronic    Depression    Hiatal hernia    Hypercholesteremia    Hypertension    Hypothyroidism    PONV (postoperative nausea and vomiting)       Objective:     Wt Readings from Last 3 Encounters:  03/21/23 168 lb 6.4 oz (76.4 kg)  02/12/23 170 lb (77.1 kg)  07/24/20 185 lb (83.9 kg)      Vital signs reviewed  03/21/2023  - Note at rest 02 sats  97% on RA   General appearance:    pleasant hoarse wf harsh cough on isnp     HEENT : Oropharynx  clear      Nasal turbinates nl    NECK :  without  apparent JVD/ palpable Nodes/TM    LUNGS: no acc muscle use,  kyphotic contour chest with ins coarse crackles and rhonchi and cough on inspiration   CV:  RRR  no s3  and 2/6 SEM  s  increase in P2, and no edema   ABD:  soft and nontender   MS:  Gait slt unsteady/ walks with R cane  ext warm without deformities Or obvious joint restrictions  calf tenderness, cyanosis or clubbing    SKIN: warm and dry without lesions    NEURO:  alert, approp, nl sensorium with  no motor or cerebellar deficits apparent.       I personally reviewed images and agree with radiology impression as follows:   Chest CT w contrast 12/13/22        1. Peripheral predominant reticular opacities with subpleural sparing and traction bronchiectasis/bronchiolectasis. Findings are consistent with interstitial lung disease, pattern most consistent with fibrotic NSIP. Chronic sequela of prior COVID 19 pneumonia is an additional consideration. Findings are suggestive of an alternative diagnosis (not UIP)   2. Pulmonary nodules, largest is located in the right lower lobe and measures 8 mm in mean diameter.   3. Dilated main pulmonary artery, which  can be seen in the setting of pulmonary hypertension. 4. Moderate hiatal hernia.  Assessment

## 2023-03-21 ENCOUNTER — Encounter: Payer: Self-pay | Admitting: Internal Medicine

## 2023-03-21 ENCOUNTER — Ambulatory Visit (HOSPITAL_COMMUNITY)
Admission: RE | Admit: 2023-03-21 | Discharge: 2023-03-21 | Disposition: A | Discharge status: Home / Self Care | Payer: Medicare Other | Source: Ambulatory Visit | Attending: Internal Medicine | Admitting: Internal Medicine

## 2023-03-21 ENCOUNTER — Ambulatory Visit: Payer: Medicare Other | Admitting: Internal Medicine

## 2023-03-21 VITALS — BP 122/54 | HR 91 | Temp 99.1°F | Ht 67.0 in | Wt 168.4 lb

## 2023-03-21 DIAGNOSIS — R0609 Other forms of dyspnea: Secondary | ICD-10-CM | POA: Insufficient documentation

## 2023-03-21 DIAGNOSIS — R21 Rash and other nonspecific skin eruption: Secondary | ICD-10-CM

## 2023-03-21 MED ORDER — PANTOPRAZOLE SODIUM 40 MG PO TBEC: 40.0000 mg | DELAYED_RELEASE_TABLET | Freq: Every day | ORAL | 2 refills | Status: AC

## 2023-03-21 MED ORDER — FAMOTIDINE 20 MG PO TABS: ORAL_TABLET | ORAL | Status: AC

## 2023-03-21 NOTE — Patient Instructions (Addendum)
Stop robitussion and ok to try off zyrtec and just take it if needed for itching sneezing runny nose   For cough / congestion >  mucinex dm 1200 mg every 12 hours and the flutter valve    6-8 inch  bed blocks under the headboards   Pantoprazole (protonix) 40 mg   Take  30-60 min before first meal of the day and Pepcid (famotidine)  20 mg after supper at least an hour before bedtime until return to office - this is the best way to tell whether stomach acid is contributing to your problem.     No MINT menthol /chocolate  - try jolly ranchers or lifesaver (not white ones)    Please remember to go to the  x-ray department  @  [facility] for your tests - we will call you with the results when they are available     Please schedule a follow up office visit in 6 weeks, call sooner if needed

## 2023-03-21 NOTE — Assessment & Plan Note (Addendum)
Assoc with non specific findings on CT chest 12/13/22 with mod HH  -  Labs 02/12/23   ESR 5,  IgE  1043 and Eos 0.2 on Dupixent since 02/08/23  - 03/21/2023   Walked on RA  x  2  lap(s) =  approx 300  ft  @ slow/cane  [place], stopped due to tired > sob  with lowest 02 sats 93%   -  03/21/2023 assessment :cough is > doe  CT most c/w PF p Covid, not UIP  - assoc cough on insp likely due to PF but may be partly related to GERD/ HH > plus max lifestyle change try protonix in am and pepcid in pm  with mucinex/flutter valve as needed for cough then regroup  in 6 weeks   Discussed in detail all the  indications, usual  risks and alternatives  relative to the benefits with patient who agrees to proceed with Rx as outlined.      Each maintenance medication was reviewed in detail including emphasizing most importantly the difference between maintenance and prns and under what circumstances the prns are to be triggered using an action plan format where appropriate.  Total time for H and P, chart review, counseling,  directly observing portions of ambulatory 02 saturation study/ and generating customized AVS unique to this office visit / same day charting > 30 min

## 2023-04-29 NOTE — Progress Notes (Unsigned)
 Kelli Gregory, female    DOB: 05-23-1935    MRN: [id number]   Brief patient profile:  71  yowf  never smoker with no prior resp issues but  "never right since covid infection x 3 and shots x 2"  referred to pulmonary clinic in [place]  02/12/2023 by Kelli Gregory  for ? new onset cough around late June  2024 dx as CAP and  rx with levaquin > not improved and subsequent itchy rash in late July 2024 > bx in [place] 12/06/22 c/w drug reacion  and started on dupixent and prednisone since 1st week in [place] 2024 ? Maybe a little better   Skin bx Dec 06 2022  "lymphcytic infiltrated with Eos c/w drug eruption" > rx dupixent per derm since 02/08/23    History of Present Illness  02/12/2023  Pulmonary/ 1st office eval/ Kelli Gregory / [place] [facility] dupixent first shots 02/08/23 / prednisone on day 3= 40 mg  Chief Complaint  Patient presents with   Establish Care   Pulmonary Nodules    CT Chest 12/13/22  Dyspnea:  no problem pushing cart around [employer]  Cough: slt rattling non-productive worse at hs and then all night  Sleep: bed is flat one pillow  SABA use: none  02: none Rec For cough/ congestion >   Mucinex dm  up to maximum of  1200 mg every 12 hours as needed  For drainage / throat tickle try take CHLORPHENIRAMINE  4 mg  along with another pepcid 20 mg. If condition gets worse recommend go to [facility]   Please schedule a follow up office visit in 4 weeks, sooner if needed  with all medications /inhalers/ solutions in hand    03/21/2023  f/u ov/[place] office/Kelli Gregory re: cough  maint on dupixent / zyrtec  10 mg each am x 6 m planned and pepcid bid / no more prednisone/ itching /rash resolved Dyspnea:  pushes cart at [employer] / [employer]  Cough: yellow thick mucus zpak  Sleeping: flat bed/ one pillow  bad cough  x  SABA use: not using  02: none  Rec Stop robitussion and ok to try off zyrtec and just take it if needed for itching sneezing runny nose  For cough / congestion >  mucinex dm 1200 mg every  12 hours and the flutter valve  6-8 inch  bed blocks under the headboards Pantoprazole (protonix) 40 mg   Take  30-60 min before first meal of the day and Pepcid (famotidine)  20 mg after supper No MINT menthol /chocolate  - try jolly ranchers or lifesaver (not white ones)        05/02/2023  f/u ov/[place] office/Kelli Gregory re: UACS  maint on gerd rx   Chief Complaint  Patient presents with   Follow-up    6 month follow up   Dyspnea:  mb and back maybe 75 ft and back flat walking with cane s doe  Cough: starts up 10 min p rising bu overall improved / some at hs as well, min mucoid Sleeping: bed blocks s    resp cc once asleeep  SABA use: none  02: none     No obvious day to day or daytime variability or assoc excess/ purulent sputum or mucus plugs or hemoptysis or cp or chest tightness, subjective wheeze or overt sinus or hb symptoms.    Also denies any obvious fluctuation of symptoms with weather or environmental changes or other aggravating or alleviating factors except as outlined  above   No unusual exposure hx or h/o childhood pna/ asthma or knowledge of premature birth.  Current Allergies, Complete Past Medical History, Past Surgical History, Family History, and Social History were reviewed in [name] record.  ROS  The following are not active complaints unless bolded Hoarseness, sore throat, dysphagia, dental problems, itching, sneezing,  nasal congestion or discharge of excess mucus or purulent secretions, ear ache,   fever, chills, sweats, unintended wt loss or wt gain, classically pleuritic or exertional cp,  orthopnea pnd or arm/hand swelling  or leg swelling, presyncope, palpitations, abdominal pain, anorexia, nausea, vomiting, diarrhea  or change in bowel habits or change in bladder habits, change in stools or change in urine, dysuria, hematuria,  rash, arthralgias, visual complaints, headache, numbness, weakness or ataxia or problems with walking or  coordination,  change in mood or  memory.        Current Meds  Medication Sig   amLODipine (NORVASC) 2.5 MG tablet Take 2.5 mg by mouth daily.   cetirizine (ZYRTEC) 10 MG tablet Take 10 mg by mouth every morning.   DUPIXENT 300 MG/2ML SOAJ    famotidine (PEPCID) 20 MG tablet One after supper   hydrochlorothiazide (HYDRODIURIL) 25 MG tablet    levothyroxine (SYNTHROID) 112 MCG tablet Take 112 mcg by mouth daily.   losartan (COZAAR) 100 MG tablet    pantoprazole (PROTONIX) 40 MG tablet Take 1 tablet (40 mg total) by mouth daily. Take 30-60 min before first meal of the day   rosuvastatin (CRESTOR) 5 MG tablet TAKE 1 TABLET BY MOUTH TWICE A WEEK            Past Medical History:  Diagnosis Date   Arm fracture, left    Arthritis    Back pain, chronic    Depression    Hiatal hernia    Hypercholesteremia    Hypertension    Hypothyroidism    PONV (postoperative nausea and vomiting)       Objective:    Wts   05/02/2023        168   03/21/23 168 lb 6.4 oz (76.4 kg)  02/12/23 170 lb (77.1 kg)  07/24/20 185 lb (83.9 kg)    Vital signs reviewed  05/02/2023  - Note at rest 02 sats  96% on RA   General appearance:    amb wf nad   HEENT : Oropharynx  clear      Nasal turbinates nl    NECK :  without  apparent JVD/ palpable Nodes/TM    LUNGS: no acc muscle use, Kyphotic contour chest  with trace crackles on deep insp assoc with minimal dry cough    CV:  RRR  no s3 or  2/6 SEM s increase in P2, and no edema   ABD:  soft and nontender   MS:  Gait nl  ext warm without deformities Or obvious joint restrictions  calf tenderness, cyanosis or clubbing    SKIN: warm and dry without lesions    NEURO:  alert, approp, nl sensorium with  no motor or cerebellar deficits apparent.        I personally reviewed images and agree with radiology impression as follows:  CXR:   Pa and lateral 03/21/23 1. No acute cardiopulmonary disease. 2. Interstitial disease possibly the sequela of  COVID-19 pneumonia   I personally reviewed images and agree with radiology impression as follows:   Chest CT w contrast 12/13/22  1. Peripheral predominant reticular opacities with subpleural sparing and traction bronchiectasis/bronchiolectasis. Findings are consistent with interstitial lung disease, pattern most consistent with fibrotic NSIP. Chronic sequela of prior COVID 19 pneumonia is an additional consideration. Findings are suggestive of an alternative diagnosis (not UIP)   2. Pulmonary nodules, largest is located in the right lower lobe and measures 8 mm in mean diameter.   3. Dilated main pulmonary artery, which can be seen in the setting of pulmonary hypertension. 4. Moderate hiatal hernia.  Assessment

## 2023-05-02 ENCOUNTER — Encounter: Payer: Self-pay | Admitting: Internal Medicine

## 2023-05-02 ENCOUNTER — Ambulatory Visit: Payer: Medicare Other | Admitting: Internal Medicine

## 2023-05-02 VITALS — BP 133/72 | Ht 67.0 in | Wt 168.2 lb

## 2023-05-02 DIAGNOSIS — I2699 Other pulmonary embolism without acute cor pulmonale: Secondary | ICD-10-CM

## 2023-05-02 DIAGNOSIS — R0609 Other forms of dyspnea: Secondary | ICD-10-CM | POA: Diagnosis not present

## 2023-05-02 NOTE — Assessment & Plan Note (Signed)
 Assoc with non specific findings on CT chest   -  CT 110/11/24 most c/w PF p Covid  or NSI{P , not UIP  -  Labs 02/12/23   ESR 5,  IgE  1043 and Eos 0.2 on Dupixent since 02/08/23  - 03/21/2023   Walked on RA  x  2  lap(s) =  approx 300  ft  @ slow/cane  [place], stopped due to tired > sob  with lowest 02 sats 93%   -  03/21/2023 assessment :cough is > doe  -assoc cough on insp likely du to PF but may be partly related to GERD/ HH > plus max lifestyle change try protonix in am and pepcid in pm x 6 weeks then regroup  - 05/02/2023 cough better on max gerd rx > f/u hrct for 06/2023 planned   She has ho Rheumatism or risk factors for HSP so likely this is all just stable post covid PF > f/u 6 m with hrct to close the loop

## 2023-05-02 NOTE — Patient Instructions (Addendum)
 My office will be contacting you by phone for referral for High Res CT chest mid April 2025  - if you don't hear back from my office within one week please call us back or notify us thru MyChart and we'll address it right away.   Make sure you check your oxygen saturation at your highest level of activity(NOT after you stop)  to be sure it stays over 90% and keep track of it at least once a week, more often if breathing getting worse, and let me know if losing ground. (Collect the dots to connect the dots approach)    I will call with report to decide whether follow up is needed

## 2023-05-02 NOTE — Assessment & Plan Note (Signed)
 Never smoker - See ct chest 10/11/2 max size 8 mm  - rec HRCT 06/13/23 to close the loop in what is very likely benign / inflammatory in nature.   Discussed in detail all the  indications, usual  risks and alternatives  relative to the benefits with patient who agrees to proceed with w/u as outlined.         Each maintenance medication was reviewed in detail including emphasizing most importantly the difference between maintenance and prns and under what circumstances the prns are to be triggered using an action plan format where appropriate.  Total time for H and P, chart review, counseling,  and generating customized AVS unique to this office visit / same day charting = 32 min

## 2023-06-19 ENCOUNTER — Ambulatory Visit (HOSPITAL_COMMUNITY)

## 2023-07-24 ENCOUNTER — Ambulatory Visit (INDEPENDENT_AMBULATORY_CARE_PROVIDER_SITE_OTHER): Admitting: Orthopedic Surgery

## 2023-07-24 ENCOUNTER — Encounter: Payer: Self-pay | Admitting: Orthopedic Surgery

## 2023-07-24 DIAGNOSIS — M25319 Other instability, unspecified shoulder: Secondary | ICD-10-CM | POA: Diagnosis not present

## 2023-07-24 DIAGNOSIS — M25512 Pain in left shoulder: Secondary | ICD-10-CM | POA: Diagnosis not present

## 2023-07-24 DIAGNOSIS — M25511 Pain in right shoulder: Secondary | ICD-10-CM

## 2023-07-24 DIAGNOSIS — M5030 Other cervical disc degeneration, unspecified cervical region: Secondary | ICD-10-CM | POA: Diagnosis not present

## 2023-07-24 MED ORDER — TIZANIDINE HCL 4 MG PO TABS: 4.0000 mg | ORAL_TABLET | Freq: Four times a day (QID) | ORAL | 5 refills | Status: DC | PRN

## 2023-07-24 MED ORDER — METHYLPREDNISOLONE ACETATE 40 MG/ML IJ SUSP
40.0000 mg | Freq: Once | INTRAMUSCULAR | Status: AC
  Administered 2023-07-24: 40 mg via INTRA_ARTICULAR

## 2023-07-24 MED ORDER — IBUPROFEN 800 MG PO TABS: 800.0000 mg | ORAL_TABLET | Freq: Three times a day (TID) | ORAL | 5 refills | Status: DC | PRN

## 2023-07-24 NOTE — Patient Instructions (Signed)
 You have received an injection of steroids into the joint. 15% of patients will have increased pain within the 24 hours postinjection.   This is transient and will go away.   We recommend that you use ice packs on the injection site for 20 minutes every 2 hours and extra strength Tylenol 2 tablets every 8 as needed until the pain resolves.  If you continue to have pain after taking the Tylenol and using the ice please call the office for further instructions.

## 2023-07-24 NOTE — Progress Notes (Signed)
 Chief Complaint  Patient presents with   Injections    Both shoulders    88 year old female previously seen for issues with her shoulders but last seen Jul 24, 2020 for knee problems shoulder issues were dealt with in 2019  Comes in today with bilateral shoulder pain right greater than left  Symptoms seem to be sound like bursitis may be arthritis  Exam of the right shoulder crepitance is noted on range of motion in all planes with normal passive range of motion in terms of flexion extension external rotation decreased internal rotation  As far as the left shoulder goes there is more crepitance on range of motion of the left shoulder I do not detect any weakness her passive range of motion seems similar to the right it is painful though  She is 88 years old she is not interested in any surgery she is having some neck pain and would like some muscle relaxers and ibuprofen  for that and this should help with her shoulder pain as well  Procedure injection right shoulder subacromial joint and left shoulder subacromial joint   procedure note the subacromial injection shoulder RIGHT  Verbal consent was obtained to inject the  RIGHT   Shoulder  Timeout was completed to confirm the injection site is a subacromial space of the  RIGHT  shoulder   Medication used Depo-Medrol  40 mg and lidocaine  1% 3 cc  Anesthesia was provided by ethyl chloride  The injection was performed in the RIGHT  posterior subacromial space. After pinning the skin with alcohol and anesthetized the skin with ethyl chloride the subacromial space was injected using a 20-gauge needle. There were no complications  Sterile dressing was applied.    Procedure note the subacromial injection shoulder left   Verbal consent was obtained to inject the  Left   Shoulder  Timeout was completed to confirm the injection site is a subacromial space of the  left  shoulder  Medication used Depo-Medrol  40 mg and lidocaine  1% 3  cc  Anesthesia was provided by ethyl chloride  The injection was performed in the left  posterior subacromial space. After pinning the skin with alcohol and anesthetized the skin with ethyl chloride the subacromial space was injected using a 20-gauge needle. There were no complications  Sterile dressing was applied.   Encounter Diagnoses  Name Primary?   Rotator cuff insufficiency, unspecified laterality Yes   Pain of both shoulder joints    Degenerative disc disease, cervical    Meds ordered this encounter  Medications   methylPREDNISolone  acetate (DEPO-MEDROL ) injection 40 mg   methylPREDNISolone  acetate (DEPO-MEDROL ) injection 40 mg   ibuprofen  (ADVIL ) 800 MG tablet    Sig: Take 1 tablet (800 mg total) by mouth every 8 (eight) hours as needed.    Dispense:  90 tablet    Refill:  5   tiZANidine (ZANAFLEX) 4 MG tablet    Sig: Take 1 tablet (4 mg total) by mouth every 6 (six) hours as needed for muscle spasms.    Dispense:  60 tablet    Refill:  5

## 2023-07-25 ENCOUNTER — Ambulatory Visit (HOSPITAL_COMMUNITY)
Admission: RE | Admit: 2023-07-25 | Discharge: 2023-07-25 | Disposition: A | Discharge status: Home / Self Care | Source: Ambulatory Visit | Attending: Internal Medicine | Admitting: Internal Medicine

## 2023-07-25 DIAGNOSIS — R0609 Other forms of dyspnea: Secondary | ICD-10-CM | POA: Diagnosis present

## 2023-08-01 ENCOUNTER — Ambulatory Visit: Payer: Self-pay | Admitting: Internal Medicine

## 2023-10-12 NOTE — Progress Notes (Unsigned)
 Kelli Gregory, female    DOB: 1935-04-08    MRN: [id number]   Brief patient profile:  23  yowf  never smoker with no prior resp issues but  never right since covid infection x 3 and shots x 2  referred to pulmonary clinic in [place]  02/12/2023 by Dr Sheryle  for ? new onset cough around late June  2024 dx as CAP and  rx with levaquin > not improved and subsequent itchy rash in late July 2024 > bx in [place] 12/06/22 c/w drug reacion  and started on dupixent and prednisone since 1st week in [place] 2024 ? Maybe a little better   Skin bx Dec 06 2022  lymphcytic infiltrated with Eos c/w drug eruption > rx dupixent per derm since 02/08/23    History of Present Illness  02/12/2023  Pulmonary/ 1st office eval/ Kelli Gregory / [place] [facility] dupixent first shots 02/08/23 / prednisone on day 3= 40 mg  Chief Complaint  Patient presents with   Establish Care   Pulmonary Nodules    CT Chest 12/13/22  Dyspnea:  no problem pushing cart around [employer]  Cough: slt rattling non-productive worse at hs and then all night  Sleep: bed is flat one pillow  SABA use: none  02: none Rec For cough/ congestion >   Mucinex dm  up to maximum of  1200 mg every 12 hours as needed  For drainage / throat tickle try take CHLORPHENIRAMINE  4 mg  along with another pepcid  20 mg. If condition gets worse recommend go to [facility]   Please schedule a follow up office visit in 4 weeks, sooner if needed  with all medications /inhalers/ solutions in hand    03/21/2023  f/u ov/[place] office/Kelli Gregory re: cough  maint on dupixent / zyrtec  10 mg each am x 6 m planned and pepcid  bid / no more prednisone/ itching /rash resolved Dyspnea:  pushes cart at [employer]  Cough: yellow thick mucus zpak  Sleeping: flat bed/ one pillow  bad cough  x  SABA use: not using  02: none  Rec Stop robitussion and ok to try off zyrtec and just take it if needed for itching sneezing runny nose  For cough / congestion >  mucinex dm 1200 mg every  12 hours and the flutter valve  6-8 inch  bed blocks under the headboards Pantoprazole  (protonix ) 40 mg   Take  30-60 min before first meal of the day and Pepcid  (famotidine )  20 mg after supper No MINT menthol /chocolate  - try jolly ranchers or lifesaver (not white ones)        05/02/2023  f/u ov/[place] office/Kelli Gregory re: UACS  maint on gerd rx   Chief Complaint  Patient presents with   Follow-up    6 month follow up   Dyspnea:  mb and back maybe 75 ft and back flat walking with cane s doe  Cough: starts up 10 min p rising bu overall improved / some at hs as well, min mucoid Sleeping: bed blocks s    resp cc once asleeep  SABA use: none  02: none Rec Make sure you check your oxygen saturation at your highest level of activity(NOT after you stop)  to be sure it stays over 90%     I personally reviewed images and agree with radiology impression as follows:   Chest HRCT 07/25/23  1. Pulmonary parenchymal pattern of interstitial lung disease, as detailed above, stable from  12/13/2022. Findings may be due to post COVID-19 inflammatory fibrosis. Findings are indeterminate for UIP per consensus guidelines:   2. 4 mm apical segment right upper lobe nodule, stable  3. Liver margin is irregular, indicative of cirrhosis. 4. Aortic atherosclerosis (ICD10-I70.0).  Coronary calcification. 5. Enlarged pulmonic trunk, indicative of pulmonary arterial hypertension   10/13/2023  f/u ov/[place] office/Kelli Gregory re: UACS/ NSIP vs covid pf/HH maint on dupixent   Chief Complaint  Patient presents with   Cough    Yellow mucus - HRCT 07/25/23  Dyspnea:  walks at  [employer] a little slower cane or buggy [name]  Cough: minimal throat clearing worse this time every year s nasal flares Sleeping: bed blocks/ one pillow  resp cc  SABA use: none  02: none    No obvious day to day or daytime variability or assoc   mucus plugs or hemoptysis or cp or chest tightness, subjective wheeze or overt sinus or hb  symptoms.    Also denies any obvious fluctuation of symptoms with weather or environmental changes or other aggravating or alleviating factors except as outlined above   No unusual exposure hx or h/o childhood pna/ asthma or knowledge of premature birth.  Current Allergies, Complete Past Medical History, Past Surgical History, Family History, and Social History were reviewed in [name] record.  ROS  The following are not active complaints unless bolded Hoarseness, sore throat, dysphagia, dental problems, itching, sneezing,  nasal congestion or discharge of excess mucus or purulent secretions, ear ache,   fever, chills, sweats, unintended wt loss or wt gain, classically pleuritic or exertional cp,  orthopnea pnd or arm/hand swelling  or leg swelling, presyncope, palpitations, abdominal pain, anorexia, nausea, vomiting, diarrhea  or change in bowel habits or change in bladder habits, change in stools or change in urine, dysuria, hematuria,  rash, arthralgias, visual complaints, headache, numbness, weakness or ataxia or problems with walking/ does fine slow [place] with cane or coordination,  change in mood or  memory.        Current Meds  Medication Sig   amLODipine (NORVASC) 2.5 MG tablet Take 2.5 mg by mouth daily.   cetirizine (ZYRTEC) 10 MG tablet Take 10 mg by mouth every morning.   DUPIXENT 300 MG/2ML SOAJ    famotidine  (PEPCID ) 20 MG tablet One after supper   hydrochlorothiazide  (HYDRODIURIL ) 25 MG tablet    ibuprofen  (ADVIL ) 800 MG tablet Take 1 tablet (800 mg total) by mouth every 8 (eight) hours as needed.   levothyroxine  (SYNTHROID ) 112 MCG tablet Take 112 mcg by mouth daily.   losartan  (COZAAR ) 100 MG tablet    pantoprazole  (PROTONIX ) 40 MG tablet Take 1 tablet (40 mg total) by mouth daily. Take 30-60 min before first meal of the day   rosuvastatin (CRESTOR) 5 MG tablet TAKE 1 TABLET BY MOUTH TWICE A WEEK   tiZANidine  (ZANAFLEX ) 4 MG tablet Take 1 tablet (4  mg total) by mouth every 6 (six) hours as needed for muscle spasms.              Past Medical History:  Diagnosis Date   Arm fracture, left    Arthritis    Back pain, chronic    Depression    Hiatal hernia    Hypercholesteremia    Hypertension    Hypothyroidism    PONV (postoperative nausea and vomiting)       Objective:    Wts  10/13/2023        166  05/02/2023        168   03/21/23 168 lb 6.4 oz (76.4 kg)  02/12/23 170 lb (77.1 kg)  07/24/20 185 lb (83.9 kg)    Vital signs reviewed  10/13/2023  - Note at rest 02 sats  95% on RA   General appearance:    amb (with walker) pleasant wf nad       HEENT : Oropharynx pristine     Nasal turbinates nl    NECK :  without  apparent JVD/ palpable Nodes/TM    LUNGS: no acc muscle use, Mildly kyphotic contour chest with a few insp crackles  bilaterally without cough on insp or exp maneuvers   CV:  RRR  no s3 or 2/6 SEM s  increase in P2, and no edema   ABD:  soft and nontender   MS:     ext warm without deformities Or obvious joint restrictions  calf tenderness, cyanosis or clubbing    SKIN: warm and dry without lesions    NEURO:  alert, approp, nl sensorium with  no motor or cerebellar deficits apparent.           Assessment     Assessment & Plan DOE (dyspnea on exertion) Assoc with non specific findings on CT chest   -  CT 110/11/24 most c/w PF p Covid  or NSI{P , not UIP  -  Labs 02/12/23   ESR 5,  IgE  1043 and Eos 0.2 on Dupixent since 02/08/23  - 03/21/2023   Walked on RA  x  2  lap(s) =  approx 300  ft  @ slow/cane  [place], stopped due to tired > sob  with lowest 02 sats 93%   -  03/21/2023 assessment :cough is > doe  -assoc cough on insp likely du to PF but may be partly related to GERD/ HH > plus max lifestyle change try protonix  in am and pepcid  in pm x 6 weeks then regroup  - 05/02/2023 cough better on max gerd rx  - final f/u CT 07/25/23 > no change c/w post covid PF not UIP   >>>  final ov 10/13/2023  rec continue max gerd rx/ monitor ex sats and f/u prn  - see avs for instructions unique to this ov       Each maintenance medication was reviewed in detail including emphasizing most importantly the difference between maintenance and prns and under what circumstances the prns are to be triggered using an action plan format where appropriate.  Total time for H and P, chart review, counseling, reviewing pulse ox  device(s) and generating customized AVS unique to this office visit / same day charting = 30 min final summary f/u ov           Patient Instructions  Make sure you check your oxygen saturation at your highest level of activity(NOT after you stop)  to be sure it stays over 90% and keep track of it at least once a week, more often if breathing getting worse, and let me know if losing ground. (Collect the dots to connect the dots approach)     Try zyrtec 10 mg take in pm in case it makes you sleepy  Keep the candy hand - but not chocalate   If you are satisfied with your treatment plan,  let your doctor know and he/she can either refill your medications or you can return here when your prescription runs out.     If in any  way you are not 100% satisfied,  please tell us .  If 100% better, tell your friends!  Pulmonary follow up is as needed       Ozell America, MD 10/13/2023

## 2023-10-13 ENCOUNTER — Encounter: Payer: Self-pay | Admitting: Internal Medicine

## 2023-10-13 ENCOUNTER — Ambulatory Visit (INDEPENDENT_AMBULATORY_CARE_PROVIDER_SITE_OTHER): Admitting: Internal Medicine

## 2023-10-13 VITALS — BP 145/68 | HR 87 | Ht 67.0 in | Wt 166.8 lb

## 2023-10-13 DIAGNOSIS — R0609 Other forms of dyspnea: Secondary | ICD-10-CM | POA: Diagnosis not present

## 2023-10-13 DIAGNOSIS — I2699 Other pulmonary embolism without acute cor pulmonale: Secondary | ICD-10-CM

## 2023-10-13 NOTE — Patient Instructions (Addendum)
 Make sure you check your oxygen saturation at your highest level of activity(NOT after you stop)  to be sure it stays over 90% and keep track of it at least once a week, more often if breathing getting worse, and let me know if losing ground. (Collect the dots to connect the dots approach)     Try zyrtec 10 mg take in pm in case it makes you sleepy  Keep the candy hand - but not chocalate   If you are satisfied with your treatment plan,  let your doctor know and he/she can either refill your medications or you can return here when your prescription runs out.     If in any way you are not 100% satisfied,  please tell us .  If 100% better, tell your friends!  Pulmonary follow up is as needed

## 2023-10-13 NOTE — Assessment & Plan Note (Addendum)
 Assoc with non specific findings on CT chest   -  CT 110/11/24 most c/w PF p Covid  or NSI{P , not UIP  -  Labs 02/12/23   ESR 5,  IgE  1043 and Eos 0.2 on Dupixent since 02/08/23  - 03/21/2023   Walked on RA  x  2  lap(s) =  approx 300  ft  @ slow/cane  [place], stopped due to tired > sob  with lowest 02 sats 93%   -  03/21/2023 assessment :cough is > doe  -assoc cough on insp likely du to PF but may be partly related to GERD/ HH > plus max lifestyle change try protonix  in am and pepcid  in pm x 6 weeks then regroup  - 05/02/2023 cough better on max gerd rx  - final f/u CT 07/25/23 > no change c/w post covid PF not UIP   >>>  final ov 10/13/2023 rec continue max gerd rx/ monitor ex sats and f/u prn  - see avs for instructions unique to this ov       Each maintenance medication was reviewed in detail including emphasizing most importantly the difference between maintenance and prns and under what circumstances the prns are to be triggered using an action plan format where appropriate.  Total time for H and P, chart review, counseling, reviewing pulse ox  device(s) and generating customized AVS unique to this office visit / same day charting = 30 min final summary f/u ov

## 2023-12-17 ENCOUNTER — Encounter: Admitting: Orthopedic Surgery

## 2024-01-05 ENCOUNTER — Encounter: Payer: Self-pay | Admitting: Orthopedic Surgery

## 2024-01-05 ENCOUNTER — Ambulatory Visit (INDEPENDENT_AMBULATORY_CARE_PROVIDER_SITE_OTHER): Admitting: Orthopedic Surgery

## 2024-01-05 ENCOUNTER — Other Ambulatory Visit (INDEPENDENT_AMBULATORY_CARE_PROVIDER_SITE_OTHER): Payer: Self-pay

## 2024-01-05 VITALS — BP 152/81 | HR 93 | Ht 64.0 in | Wt 167.0 lb

## 2024-01-05 DIAGNOSIS — M25319 Other instability, unspecified shoulder: Secondary | ICD-10-CM

## 2024-01-05 DIAGNOSIS — M25511 Pain in right shoulder: Secondary | ICD-10-CM

## 2024-01-05 DIAGNOSIS — M5030 Other cervical disc degeneration, unspecified cervical region: Secondary | ICD-10-CM

## 2024-01-05 DIAGNOSIS — M25512 Pain in left shoulder: Secondary | ICD-10-CM | POA: Diagnosis not present

## 2024-01-05 MED ORDER — METHYLPREDNISOLONE ACETATE 40 MG/ML IJ SUSP
40.0000 mg | Freq: Once | INTRAMUSCULAR | Status: AC
  Administered 2024-01-05: 40 mg via INTRA_ARTICULAR

## 2024-01-05 MED ORDER — IBUPROFEN 800 MG PO TABS: 800.0000 mg | ORAL_TABLET | Freq: Three times a day (TID) | ORAL | 5 refills | Status: AC | PRN

## 2024-01-05 MED ORDER — TIZANIDINE HCL 4 MG PO TABS: 4.0000 mg | ORAL_TABLET | Freq: Four times a day (QID) | ORAL | 5 refills | Status: AC | PRN

## 2024-01-05 NOTE — Progress Notes (Signed)
 Chief Complaint  Patient presents with   Neck Pain    Neck pain recurrent, no arm symptoms no numbness tingling or weakness   History: 88 year old female treated for degenerative disc disease cervical spine in May does not want physical therapy comes in with recurrent neck pain for the last 2 months no complaints of numbness or tingling in her hands or arms no weakness in the upper extremities  Review of systems as stated  Pertinent medical problems hypothyroidism hypertension chronic back pain  BP (!) 152/81   Pulse 93   Ht 5' 4 (1.626 m)   Wt 167 lb (75.8 kg)   BMI 28.67 kg/m   Physical Exam Constitutional:      General: She is not in acute distress.    Appearance: Normal appearance. She is normal weight. She is not ill-appearing, toxic-appearing or diaphoretic.  HENT:     Head: Normocephalic and atraumatic.     Nose: No congestion or rhinorrhea.  Eyes:     General: No scleral icterus.       Right eye: No discharge.        Left eye: No discharge.     Extraocular Movements: Extraocular movements intact.     Pupils: Pupils are equal, round, and reactive to light.  Cardiovascular:     Rate and Rhythm: Normal rate.     Pulses: Normal pulses.  Musculoskeletal:     Cervical back: Tenderness present.  Lymphadenopathy:     Cervical: No cervical adenopathy.  Skin:    General: Skin is warm and dry.  Neurological:     General: No focal deficit present.     Mental Status: She is alert. Mental status is at baseline. She is disoriented.     Sensory: No sensory deficit.     Motor: No weakness.     Coordination: Coordination normal.     Gait: Gait normal.     Deep Tendon Reflexes: Reflexes normal.  Psychiatric:        Mood and Affect: Mood normal.        Behavior: Behavior normal.        Thought Content: Thought content normal.        Judgment: Judgment normal.    Back Exam   Tenderness  The patient is experiencing tenderness in the cervical.  Range of Motion   Extension:  abnormal  Flexion:  abnormal  Lateral bend right:  abnormal  Lateral bend left:  abnormal  Rotation right:  abnormal  Rotation left:  abnormal   Comments:  She has excellent grip strength in both upper extremities no myelopathic signs       Encounter Diagnoses  Name Primary?   DDD (degenerative disc disease), cervical Yes   Degenerative disc disease, cervical    Rotator cuff insufficiency, unspecified laterality    Pain of both shoulder joints    The patient would also like shoulder injections  So we gave her 2 shoulder injections  She refused physical therapy  She can continue with Tylenol , ibuprofen  and a muscle relaxer  Local measures should also help    Procedure note for injection   Chief Complaint  Patient presents with   Neck Pain    Neck pain recurrent, no arm symptoms no numbness tingling or weakness     Encounter Diagnoses  Name Primary?   DDD (degenerative disc disease), cervical Yes   Degenerative disc disease, cervical    Rotator cuff insufficiency, unspecified laterality    Pain of both  shoulder joints         The patient has consented for injection of the right and left Joint: shoulder subacromial space  Medication: Depo-Medrol  40 mg and lidocaine  1%  Time out completed: Yes  Right side first  The site of injection was cleaned with alcohol and ethyl chloride.  The injection was given without any complications appropriate precautions were given.  Repeated left side

## 2024-01-05 NOTE — Patient Instructions (Addendum)
 We recommend you go to physical therapy which is the best treatment for your arthritic condition of your cervical spine  Other things you can do to help  Use a heating pad as needed  Take Tylenol  500 mg every 6 hours  Take ibuprofen  800 mg every 8 hours  Take a muscle relaxer as needed

## 2024-01-05 NOTE — Progress Notes (Signed)
  Intake history:  Chief Complaint  Patient presents with   Neck Pain     BP (!) 152/81   Pulse 93   Ht 5' 4 (1.626 m)   Wt 167 lb (75.8 kg)   BMI 28.67 kg/m  Body mass index is 28.67 kg/m.  Pharmacy? ___[facility]___________________________________  WHAT ARE WE SEEING YOU FOR TODAY?   Neck pain   How long has this bothered you? (DOI?DOS?WS?)  approximately 2 month(s) ago  Was there an injury? No  Anticoag.  No   Any ALLERGIES __________ Allergies  Allergen Reactions   Poison Sumac Extract Itching, Swelling and Rash   Sulfa  Antibiotics Nausea And Vomiting   Tramadol Nausea And Vomiting   Ciprofloxacin Other (See Comments)    Not sure    ____________________________________   Treatment:  Have you taken:  Tylenol  No  Advil  Yes  Had PT No  Had injection No  Other  _________________________
